# Patient Record
Sex: Male | Born: 1980 | Race: White | Hispanic: No | Marital: Married | Smoking: Never smoker
Health system: Southern US, Community
[De-identification: ages and names within clinical notes are randomized; demographics above are authoritative.]

## PROBLEM LIST (undated history)

## (undated) DIAGNOSIS — I10 Essential (primary) hypertension: Secondary | ICD-10-CM

## (undated) DIAGNOSIS — Z9884 Bariatric surgery status: Secondary | ICD-10-CM

## (undated) HISTORY — PX: LAPAROSCOPIC GASTRIC BANDING: SHX1100

## (undated) HISTORY — PX: LASIK: SHX215

## (undated) HISTORY — DX: Essential (primary) hypertension: I10

---

## 2012-09-27 ENCOUNTER — Emergency Department (HOSPITAL_COMMUNITY): Payer: PRIVATE HEALTH INSURANCE

## 2012-09-27 ENCOUNTER — Observation Stay (HOSPITAL_COMMUNITY)
Admission: EM | Admit: 2012-09-27 | Discharge: 2012-09-28 | Disposition: A | Discharge status: Home / Self Care | Payer: PRIVATE HEALTH INSURANCE | Attending: General Surgery | Admitting: General Surgery

## 2012-09-27 ENCOUNTER — Encounter (HOSPITAL_COMMUNITY): Payer: Self-pay | Admitting: *Deleted

## 2012-09-27 DIAGNOSIS — R109 Unspecified abdominal pain: Secondary | ICD-10-CM

## 2012-09-27 DIAGNOSIS — R1013 Epigastric pain: Secondary | ICD-10-CM | POA: Insufficient documentation

## 2012-09-27 DIAGNOSIS — K8020 Calculus of gallbladder without cholecystitis without obstruction: Secondary | ICD-10-CM | POA: Insufficient documentation

## 2012-09-27 DIAGNOSIS — K9509 Other complications of gastric band procedure: Principal | ICD-10-CM | POA: Insufficient documentation

## 2012-09-27 DIAGNOSIS — Y831 Surgical operation with implant of artificial internal device as the cause of abnormal reaction of the patient, or of later complication, without mention of misadventure at the time of the procedure: Secondary | ICD-10-CM | POA: Insufficient documentation

## 2012-09-27 DIAGNOSIS — Z9884 Bariatric surgery status: Secondary | ICD-10-CM

## 2012-09-27 HISTORY — DX: Bariatric surgery status: Z98.84

## 2012-09-27 LAB — COMPREHENSIVE METABOLIC PANEL
ALT: 21 U/L (ref 0–53)
Alkaline Phosphatase: 74 U/L (ref 39–117)
BUN: 9 mg/dL (ref 6–23)
CO2: 25 mEq/L (ref 19–32)
Chloride: 97 mEq/L (ref 96–112)
GFR calc Af Amer: >90 mL/min (ref >90)
Glucose, Bld: 123 mg/dL — ABNORMAL HIGH (ref 70–99)
Potassium: 4.2 mEq/L (ref 3.5–5.1)
Sodium: 133 mEq/L — ABNORMAL LOW (ref 135–145)
Total Bilirubin: 0.4 mg/dL (ref 0.3–1.2)
Total Protein: 7.3 g/dL (ref 6.0–8.3)

## 2012-09-27 LAB — CBC WITH DIFFERENTIAL/PLATELET
Eosinophils Absolute: 0.1 10*3/uL (ref 0.0–0.7)
Hemoglobin: 14.7 g/dL (ref 13.0–17.0)
Lymphocytes Relative: 17 % (ref 12–46)
Lymphs Abs: 2.2 10*3/uL (ref 0.7–4.0)
Monocytes Relative: 5 % (ref 3–12)
Neutro Abs: 10.2 10*3/uL — ABNORMAL HIGH (ref 1.7–7.7)
Neutrophils Relative %: 77 % (ref 43–77)
Platelets: 247 10*3/uL (ref 150–400)
RBC: 5.31 MIL/uL (ref 4.22–5.81)
WBC: 13.2 10*3/uL — ABNORMAL HIGH (ref 4.0–10.5)

## 2012-09-27 LAB — LIPASE, BLOOD: Lipase: 34 U/L (ref 11–59)

## 2012-09-27 MED ORDER — IOHEXOL 300 MG/ML  SOLN
100.0000 mL | Freq: Once | INTRAMUSCULAR | Status: AC | PRN
  Administered 2012-09-27: 100 mL via INTRAVENOUS

## 2012-09-27 MED ORDER — ACETAMINOPHEN 650 MG RE SUPP: 650.0000 mg | Freq: Four times a day (QID) | RECTAL | Status: DC | PRN

## 2012-09-27 MED ORDER — PANTOPRAZOLE SODIUM 40 MG IV SOLR
40.0000 mg | Freq: Two times a day (BID) | INTRAVENOUS | Status: DC
  Administered 2012-09-27 (×2): 40 mg via INTRAVENOUS
  Filled 2012-09-27 (×3): qty 40

## 2012-09-27 MED ORDER — KCL IN DEXTROSE-NACL 20-5-0.45 MEQ/L-%-% IV SOLN
INTRAVENOUS | Status: DC
  Administered 2012-09-27: 13:00:00 via INTRAVENOUS
  Filled 2012-09-27 (×4): qty 1000

## 2012-09-27 MED ORDER — MORPHINE SULFATE 4 MG/ML IJ SOLN
4.0000 mg | Freq: Once | INTRAMUSCULAR | Status: AC
  Administered 2012-09-27: 4 mg via INTRAVENOUS
  Filled 2012-09-27: qty 1

## 2012-09-27 MED ORDER — IOHEXOL 300 MG/ML  SOLN: 50.0000 mL | Freq: Once | INTRAMUSCULAR | Status: AC | PRN

## 2012-09-27 MED ORDER — ONDANSETRON HCL 4 MG/2ML IJ SOLN: 4.0000 mg | Freq: Four times a day (QID) | INTRAMUSCULAR | Status: DC | PRN

## 2012-09-27 MED ORDER — ACETAMINOPHEN 325 MG PO TABS
650.0000 mg | ORAL_TABLET | Freq: Four times a day (QID) | ORAL | Status: DC | PRN
  Administered 2012-09-27 – 2012-09-28 (×2): 650 mg via ORAL
  Filled 2012-09-27 (×2): qty 2

## 2012-09-27 MED ORDER — OXYCODONE HCL 5 MG PO TABS: 5.0000 mg | ORAL_TABLET | ORAL | Status: DC | PRN

## 2012-09-27 MED ORDER — HEPARIN SODIUM (PORCINE) 5000 UNIT/ML IJ SOLN
5000.0000 [IU] | Freq: Three times a day (TID) | INTRAMUSCULAR | Status: DC
  Administered 2012-09-27 – 2012-09-28 (×3): 5000 [IU] via SUBCUTANEOUS
  Filled 2012-09-27 (×6): qty 1

## 2012-09-27 MED ORDER — DIPHENHYDRAMINE HCL 12.5 MG/5ML PO ELIX: 12.5000 mg | ORAL_SOLUTION | Freq: Four times a day (QID) | ORAL | Status: DC | PRN

## 2012-09-27 MED ORDER — MORPHINE SULFATE 2 MG/ML IJ SOLN: 2.0000 mg | INTRAMUSCULAR | Status: DC | PRN

## 2012-09-27 MED ORDER — ONDANSETRON HCL 4 MG/2ML IJ SOLN
4.0000 mg | Freq: Once | INTRAMUSCULAR | Status: AC
  Administered 2012-09-27: 4 mg via INTRAVENOUS
  Filled 2012-09-27: qty 2

## 2012-09-27 MED ORDER — DIPHENHYDRAMINE HCL 50 MG/ML IJ SOLN: 12.5000 mg | Freq: Four times a day (QID) | INTRAMUSCULAR | Status: DC | PRN

## 2012-09-27 MED ORDER — IOHEXOL 300 MG/ML  SOLN: 50.0000 mL | Freq: Once | INTRAMUSCULAR | Status: DC | PRN

## 2012-09-27 NOTE — ED Notes (Signed)
Pt states 4-5 hours ago started having mid abdominal pain, states has had looser stools but denies n/v.

## 2012-09-27 NOTE — ED Notes (Signed)
Bed: WA25 Expected date:  Expected time:  Means of arrival:  Comments: Hall A 

## 2012-09-27 NOTE — ED Notes (Signed)
Lap-band access port needles at bedside

## 2012-09-27 NOTE — ED Provider Notes (Signed)
CSN: [id number]     Arrival date & time 09/27/12  0249 History   First MD Initiated Contact with Patient 09/27/12 [phone]     Chief Complaint  Patient presents with  . Abdominal Pain   (Consider location/radiation/quality/duration/timing/severity/associated sxs/prior Treatment) HPI Comments: Patient with history of lap band procedure performed about 3 years ago [place]. He presents today with complaints of epigastric pain that started approximately 4-5 hours prior to presentation. He states he was playing basketball at the time but denies injuring himself. He feels nauseated but cannot vomit. He denies any fevers or chills. He states his stool has been somewhat loose denies diarrhea. He has never had a problem with his lap band in the past  Patient is a 32 y.o. male presenting with abdominal pain. The history is provided by the patient.  Abdominal Pain Pain location:  Epigastric Pain quality: cramping   Pain radiates to:  Does not radiate Pain severity:  Severe Onset quality:  Sudden Duration:  5 hours Timing:  Constant Progression:  Worsening Chronicity:  New Context: previous surgery   Context: not alcohol use   Relieved by:  Nothing Worsened by:  Nothing tried   Past Medical History  Diagnosis Date  . LAP-BAND surgery status    Past Surgical History  Procedure Laterality Date  . Laparoscopic gastric banding     No family history on file. History  Substance Use Topics  . Smoking status: Never Smoker   . Smokeless tobacco: Never Used  . Alcohol Use: No    Review of Systems  Gastrointestinal: Positive for abdominal pain.  All other systems reviewed and are negative.    Allergies  Augmentin  Home Medications   Current Outpatient Rx  Name  Route  Sig  Dispense  Refill  . Omeprazole-Sodium Bicarbonate (ZEGERID) 20-1100 MG CAPS capsule   Oral   Take 1 capsule by mouth daily before breakfast.         . simethicone (MYLICON) 80 MG chewable tablet  Oral   Chew 80 mg by mouth every 6 (six) hours as needed for flatulence.          BP 122/85  Pulse 94  Temp(Src) 98.8 F (37.1 C) (Oral)  Resp 18  Ht 6' (1.829 m)  Wt 245 lb (111.131 kg)  BMI 33.22 kg/m2  SpO2 100% Physical Exam  Nursing note and vitals reviewed. Constitutional: He is oriented to person, place, and time. He appears well-developed and well-nourished. He appears distressed.  Patient appears uncomfortable.  HENT:  Head: Normocephalic.  Mouth/Throat: Oropharynx is clear and moist.  Neck: Normal range of motion. Neck supple.  Cardiovascular: Normal rate, regular rhythm and normal heart sounds.   No murmur heard. Pulmonary/Chest: Effort normal and breath sounds normal. No respiratory distress. He has no wheezes.  Abdominal: Soft. Bowel sounds are normal. He exhibits no distension. There is tenderness.  There is tenderness to palpation in the epigastrium that is mild. There is no rebound and no guarding.  Musculoskeletal: Normal range of motion. He exhibits no edema.  Neurological: He is alert and oriented to person, place, and time.  Skin: Skin is warm and dry. He is not diaphoretic.    ED Course  Procedures (including critical care time) Labs Review Labs Reviewed  CBC WITH DIFFERENTIAL  COMPREHENSIVE METABOLIC PANEL  LIPASE, BLOOD   Imaging Review No results found.  MDM  No diagnosis found. This patient presents with abdominal pain in the epigastric region. He is 3 years  status post lap band procedure in [place]. He has never had any complications or issues. Workup today reveals an elevated white count of 13,000 and acute abdominal series shows the gastric band with an increased phi angle, concerning for a slipped band positioning. I discussed the results of this with Dr. Purnell Shoemaker from general surgery who feels as though further workup with a CT scan is indicated. This was ordered and at the time of this dictation is in process and pending. Care  the patient will be signed out at shift change to the oncoming provider who will obtain the results of the CT scan and determine the final disposition.   Geoffery Lyons, MD 09/27/12 [phone]

## 2012-09-27 NOTE — H&P (Signed)
ATTENDING ADDENDUM:  I personally reviewed patient's record, examined the patient, and formulated the following assessment and plan: CT shows possible band slip.  Band fluid removed.  UGI neg for obstruction.  RUQ US shows mildly thickened GB wall with no signs of obstruction.  Case was reviewed with Dr Daphine Deutscher, one of our bariatric surgeons as well.  Pt still with crampy abd pain after swallow eval.  We discussed possible etiologies for his pain.  It does not sound like his pain is related to his gallbladder.  We will admit for overnight observation.  If he continues to feel better, we will discharge with close f/u with one of the bariatric surgeons.

## 2012-09-27 NOTE — H&P (Signed)
Sherrie George, PA-C Physician Assistant Incomplete Surgery Consult Note Service date: 09/27/2012 9:08 AM  Reason for Consult: Abdominal pain and history of Lap Band 3 years ago. Referring Physician: Dr. Juleen China (ER)   Dale Weber is an 32 y.o. male.   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  He says he thinks it's an Allergan brand.  He does not know how much fluid is in it.  He had gone from 300 pounds to 220, but has gained back up to about 245.  He was playing basketball last PM and started having pain.  He remained uncomfortable and couldn't get comfortable, pain got progressively worse.  He got to where he couldn't really stand the pain.  He says it's mid epigastric and hasn't really changed location.  He presented to the ER early this AM about 3:50 AM  Initial  film show the gastric band with an abnormal angle at 80 degree's  CT scan confirms the Phi angle is greater than 60 degrees.  Mild proximal slippage of the stomach was suspected.  No obstruction.  I got Dr. Daphine Deutscher to pull fluid, 3.8 cc removed.  We are getting a swallowing study to see how it looks now. He also has gallstones with some abdominal gallbladder wall thickeningon CT and Ultrasound. His symptoms are not related to food and this all started acutely last pm playing basketball.    He's never had this problem before, last PM. The Swallowing study with oral contrast shows an abnormal orientation of the LapBand compatible with slippage.  He apparently has a plicated lapband, from his recollection.  This may be the cause of it's abnormal appearance.  There was no obstruction. After completing these studies, and release of the fluid in his band; he still feels some discomfort.  We will admit him for observation and see how he does with full liquids. Recheck labs in AM.        Past Medical History   Diagnosis  Date   .  LAP-BAND surgery status  2011 in [place]. He thinks it's  an Allergan type band.         Past Surgical History   Procedure  Laterality  Date   .  Laparoscopic gastric banding          Family Hx:  Mother living and in good health Father:  Overweight, CAD, AODM and hypertension.               5 Brothers overweight.   Social History: reports that he has never smoked. He has never used smokeless tobacco. He reports that he does not drink alcohol or use illicit drugs. 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  Augmentin [Amoxicillin-Pot Clavulanate]  Nausea And Vomiting      Medications:   Prior to Admission medications    Medication  Sig  Start Date  End Date  Taking?  Authorizing Provider   Omeprazole-Sodium Bicarbonate (ZEGERID) 20-1100 MG CAPS capsule  Take 1 capsule by mouth daily before breakfast.      Yes  Historical Provider, MD   simethicone (MYLICON) 80 MG chewable tablet  Chew 80 mg by mouth every 6 (six) hours as needed for flatulence.      Yes  Historical Provider, MD  Anti-infectives     None          Results for orders placed during the hospital encounter of 09/27/12 (from the past 48 hour(s))   CBC WITH DIFFERENTIAL     Status: Abnormal     Collection Time      09/27/12  3:48 AM       Result  Value  Range     WBC  13.2 (*)  4.0 - 10.5 K/uL     RBC  5.31   4.22 - 5.81 MIL/uL     Hemoglobin  14.7   13.0 - 17.0 g/dL     HCT  16.1   09.6 - 52.0 %     MCV  78.2   78.0 - 100.0 fL     MCH  27.7   26.0 - 34.0 pg     MCHC  35.4   30.0 - 36.0 g/dL     RDW  04.5   40.9 - 15.5 %     Platelets  247   150 - 400 K/uL     Neutrophils Relative %  77   43 - 77 %     Neutro Abs  10.2 (*)  1.7 - 7.7 K/uL     Lymphocytes Relative  17   12 - 46 %     Lymphs Abs  2.2   0.7 - 4.0 K/uL     Monocytes Relative  5   3 - 12 %     Monocytes Absolute  0.6   0.1 - 1.0 K/uL     Eosinophils  Relative  1   0 - 5 %     Eosinophils Absolute  0.1   0.0 - 0.7 K/uL     Basophils Relative  0   0 - 1 %     Basophils Absolute  0.0   0.0 - 0.1 K/uL   COMPREHENSIVE METABOLIC PANEL     Status: Abnormal     Collection Time      09/27/12  3:48 AM       Result  Value  Range     Sodium  133 (*)  135 - 145 mEq/L     Potassium  4.2   3.5 - 5.1 mEq/L     Comment:  MODERATE HEMOLYSIS        HEMOLYSIS AT THIS LEVEL MAY AFFECT RESULT     Chloride  97   96 - 112 mEq/L     CO2  25   19 - 32 mEq/L     Glucose, Bld  123 (*)  70 - 99 mg/dL     BUN  9   6 - 23 mg/dL     Creatinine, Ser  8.11   0.50 - 1.35 mg/dL     Calcium  9.0   8.4 - 10.5 mg/dL     Total Protein  7.3   6.0 - 8.3 g/dL     Albumin  3.7   3.5 - 5.2 g/dL     AST  35   0 - 37 U/L     ALT  21   0 - 53 U/L     Comment:  MODERATE HEMOLYSIS        HEMOLYSIS AT THIS LEVEL MAY AFFECT RESULT     Alkaline Phosphatase  74   39 - 117 U/L     Comment:  MODERATE HEMOLYSIS        HEMOLYSIS AT THIS LEVEL MAY  AFFECT RESULT     Total Bilirubin  0.4   0.3 - 1.2 mg/dL     GFR calc non Af Amer  >90   >90 mL/min     GFR calc Af Amer  >90   >90 mL/min     Comment:  (NOTE)        The eGFR has been calculated using the CKD EPI equation.        This calculation has not been validated in all clinical situations.        eGFR's persistently <90 mL/min signify possible Chronic Kidney        Disease.   LIPASE, BLOOD     Status: None     Collection Time      09/27/12  3:48 AM       Result  Value  Range     Lipase  34   11 - 59 U/L      Ct Abdomen Pelvis W Contrast   09/27/2012   *RADIOLOGY REPORT*  Clinical Data: Epigastric pain  CT ABDOMEN AND PELVIS WITH CONTRAST  Technique:  Multidetector CT imaging of the abdomen and pelvis was performed following the standard protocol during bolus administration of intravenous contrast.  Contrast: OMNIPAQUE IOHEXOL 300 MG/ML  SOLN  Comparison: 09/27/2012  Findings: Mild dependent atelectasis.  Normal heart  size.  Mild hepatic steatosis.  Gallstones.  Mild gallbladder wall thickening.  No biliary ductal dilatation.  Unremarkable spleen, pancreas, adrenal glands.  Indeterminate hypodensity interpolar right kidney measuring 12 mm. No hydroureteronephrosis.  Again, the gastric phi angle is noted to be the greater than 60 degrees.  Mild proximal slippage of the stomach is suspected.  No obstruction. No intragastric erosion.  No CT evidence for colitis. Normal appendix.  Small bowel loops are normal course and caliber. No free intraperitoneal air or fluid.  No lymphadenopathy.  Normal caliber aorta and branch vessels.  Thin-walled bladder.  No acute osseous finding.  IMPRESSION: Gastric band slippage is again suggested.  No obstruction.  Distended and thick walled gallbladder, containing stones. Acute cholecystitis is not excluded.  Correlate clinically and with ultrasound if warranted.   Original Report Authenticated By: Jearld Lesch, M.D.    Dg Abd Acute W/chest   09/27/2012   *RADIOLOGY REPORT*  Clinical Data: Abdominal pain  ACUTE ABDOMEN SERIES (ABDOMEN 2 VIEW & CHEST 1 VIEW)  Comparison: None.  Findings: Lungs clear.  Cardiomediastinal contours within normal range.  No free intraperitoneal air.  Gastric band with phi angle approximately 80 degrees.  Reservoir projects over the left mid abdomen.  Air within small and large bowel.  Moderate proximal colonic stool burden.  Organ outlines normal where seen.  No acute osseous finding.  IMPRESSION: Gastric band with increased phi angle, can be seen slipped band positioning.  Consider an upper GI follow up if clinical symptoms warrant.  No obstruction, with air noted within normal caliber small and large bowel.   Original Report Authenticated By: Jearld Lesch, M.D.     Review of Systems  Constitutional: Negative.   HENT: Negative.   Eyes: Negative.   Respiratory: Negative.   Cardiovascular: Negative.   Gastrointestinal: Positive for heartburn (some with  really bad pain), nausea and abdominal pain (mid epigastric pain). Negative for vomiting.  Genitourinary: Negative.   Musculoskeletal: Negative.   Neurological: Negative.   Endo/Heme/Allergies: Negative.   Psychiatric/Behavioral: Negative.         Some anxiety, says he eats more with anxiety  Blood  pressure 144/94, pulse 66, temperature 98.8 F (37.1 C), temperature source Oral, resp. rate 16, height 6' (1.829 m), weight 111.131 kg (245 lb), SpO2 97.00%. Physical Exam  Constitutional: He is oriented to person, place, and time. He appears well-developed and well-nourished. He appears distressed (still having some pain, but much better.  ).  HENT:   Head: Normocephalic and atraumatic.   Nose: Nose normal.  Eyes: Conjunctivae and EOM are normal. Pupils are equal, round, and reactive to light. Right eye exhibits no discharge. Left eye exhibits no discharge. No scleral icterus.  Neck: Normal range of motion. Neck supple. No JVD present. No tracheal deviation present. No thyromegaly present.  Cardiovascular: Normal rate, regular rhythm, normal heart sounds and intact distal pulses.  Exam reveals no gallop.    No murmur heard. Respiratory: Effort normal and breath sounds normal. No respiratory distress. He has no wheezes. He has no rales. He exhibits no tenderness.  GI: Soft. Bowel sounds are normal. He exhibits no distension and no mass. There is no tenderness. There is no rebound and no guarding.  Port mid to [employer] [facility] [employer]  Musculoskeletal: He exhibits no edema and no tenderness.  Lymphadenopathy:    He has no cervical adenopathy.  Neurological: He is alert and oriented to person, place, and time. No cranial nerve deficit.  Skin: Skin is warm and dry. No rash noted. He is not diaphoretic. No erythema. No pallor.  Psychiatric: He has a normal mood and affect. His behavior is normal. Judgment and thought content normal.    Assessment/Plan:   1.  Abdominal pain with lap band slippage 2.   Cholelithiasis    Plan:  Admit for observation and see how he does.  Recheck labs in AM.      Cole Klugh 09/27/2012, 9:08 AM

## 2012-09-27 NOTE — ED Notes (Signed)
Surgeon at bedside.  

## 2012-09-28 LAB — CBC
HCT: 41.1 % (ref 39.0–52.0)
Hemoglobin: 14.9 g/dL (ref 13.0–17.0)
MCV: 78.1 fL (ref 78.0–100.0)
WBC: 10.9 10*3/uL — ABNORMAL HIGH (ref 4.0–10.5)

## 2012-09-28 LAB — BASIC METABOLIC PANEL
CO2: 28 mEq/L (ref 19–32)
Chloride: 101 mEq/L (ref 96–112)
GFR calc Af Amer: >90 mL/min (ref >90)
Potassium: 3.9 mEq/L (ref 3.5–5.1)

## 2012-09-28 NOTE — Care Management Note (Signed)
    Page 1 of 1   09/28/2012     11:07:11 AM   CARE MANAGEMENT NOTE 09/28/2012  Patient:  Dale Weber, Dale Weber   Account Number:  [id number]  Date Initiated:  09/28/2012  Documentation initiated by:  Lorenda Ishihara  Subjective/Objective Assessment:   32 yo admitted with abd pain, suspect lap band slipped. PTA lived at home with spouse.     Action/Plan:   Home when stable   Anticipated DC Date:  09/28/2012   Anticipated DC Plan:  HOME/SELF CARE      DC Planning Services  CM consult      Choice offered to / List presented to:             Status of service:  Completed, signed off Medicare Important Message given?   (If response is "NO", the following Medicare IM given date fields will be blank) Date Medicare IM given:   Date Additional Medicare IM given:    Discharge Disposition:  HOME/SELF CARE  Per UR Regulation:  Reviewed for med. necessity/level of care/duration of stay  If discussed at Long Length of Stay Meetings, dates discussed:    Comments:

## 2012-09-28 NOTE — Discharge Summary (Signed)
  Physician Discharge Summary  Patient ID: Dale Weber MRN: [id number] DOB/AGE: November 30, 1980 32 y.o.  Admit date: 09/27/2012 Discharge date: 09/28/2012  Admitting Diagnosis: Abdominal Pain  Discharge Diagnosis Slipped Lap Band  Consultants None  Procedures Removal of fluid from lap band  Hospital Course:  32 yr old male with h/o gastric lap band in [place] who presented to [facility] with abdominal pain.  Evaluation suggested a slipped lap band.  A swallow study was done which showed no obstructions.  Dr. Daphine Deutscher removed the fluid from his lap band and he was admitted for observation.  Overnight he had no problems and was able to tolerate a bariatric diet.  His pain was mostly resolved and it was felt he could be discharged home.  He will follow up with Dr. Biagio Quint as an outpatient.  Physical Exam:  VSS afebrile General: NAD Abd: Soft, nontender, +BS Ext: warm, no edema     Medication List         Omeprazole-Sodium Bicarbonate 20-1100 MG Caps capsule  Commonly known as:  ZEGERID  Take 1 capsule by mouth daily before breakfast.     simethicone 80 MG chewable tablet  Commonly known as:  MYLICON  Chew 80 mg by mouth every 6 (six) hours as needed for flatulence.             Follow-up Information   Follow up with LAYTON, BRIAN DAVID, DO. (Our office will contact you with your follow up appt and time)    Specialty:  General Surgery   Contact information:   [address] [place] [address] [phone]       Signed: Denny Levy [facility] [phone]  09/28/2012, 7:46 AM

## 2012-09-28 NOTE — Discharge Summary (Signed)
ATTENDING ADDENDUM:  I personally reviewed patient's record, examined the patient, and formulated the following assessment and plan:  Feels much better.  Tolerating a diet without nausea or pain

## 2012-10-05 ENCOUNTER — Encounter (INDEPENDENT_AMBULATORY_CARE_PROVIDER_SITE_OTHER): Payer: PRIVATE HEALTH INSURANCE | Admitting: General Surgery

## 2012-10-05 ENCOUNTER — Other Ambulatory Visit (INDEPENDENT_AMBULATORY_CARE_PROVIDER_SITE_OTHER): Payer: Self-pay

## 2012-10-05 DIAGNOSIS — Z9884 Bariatric surgery status: Secondary | ICD-10-CM

## 2012-10-06 ENCOUNTER — Telehealth (INDEPENDENT_AMBULATORY_CARE_PROVIDER_SITE_OTHER): Payer: Self-pay

## 2012-10-06 NOTE — Telephone Encounter (Signed)
Called and left message for patient re: UGI scheduled for 10/20/12 arrive 7:45am for 8:00am appt.  Patient need's to be NPO after midnight.  [facility] [address]

## 2012-10-20 ENCOUNTER — Ambulatory Visit
Admission: RE | Admit: 2012-10-20 | Discharge: 2012-10-20 | Disposition: A | Discharge status: Home / Self Care | Payer: PRIVATE HEALTH INSURANCE | Source: Ambulatory Visit | Attending: General Surgery | Admitting: General Surgery

## 2012-10-20 ENCOUNTER — Other Ambulatory Visit (INDEPENDENT_AMBULATORY_CARE_PROVIDER_SITE_OTHER): Payer: Self-pay | Admitting: General Surgery

## 2012-10-20 DIAGNOSIS — Z9884 Bariatric surgery status: Secondary | ICD-10-CM

## 2012-12-07 ENCOUNTER — Other Ambulatory Visit: Payer: Self-pay

## 2012-12-10 ENCOUNTER — Telehealth: Payer: Self-pay | Admitting: "Endocrinology

## 2012-12-27 NOTE — Telephone Encounter (Signed)
  During this call the computer suddenly lost contact with the internet. I had to re-boot the computer and start a new telephone entry note.

## 2013-01-14 ENCOUNTER — Telehealth: Payer: Self-pay | Admitting: "Endocrinology

## 2013-01-14 NOTE — Telephone Encounter (Signed)
This chart entered in error. I was trying to find the chart of a child with the same name.

## 2015-04-10 IMAGING — RF DG UGI W/ HIGH DENSITY W/KUB
14 of 24 series · 14 of 24 positions shown · non-contrast
Comparison: CT of the abdomen and pelvis 09/27/2012.

CLINICAL DATA: LapBand slippage. Evaluation to exclude potential
obstruction at the level of the LapBand.

UPPER GI SERIES W/HIGH DENSITY W/KUB
TECHNIQUE: After obtaining a scout radiograph, upper GI series
performed with high density barium and effervescent agent. Thin
barium also used.
Fluoroscopy Time: 37 seconds

[Series 1: run · 1 of 1 slices shown (1 of 14)]
[im 1/1]
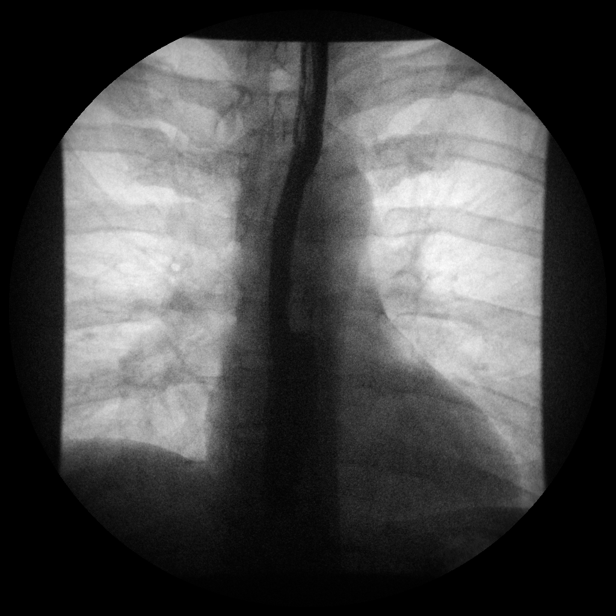

[Series 2: run · 1 of 1 slices shown (2 of 14)]
[im 1/1]
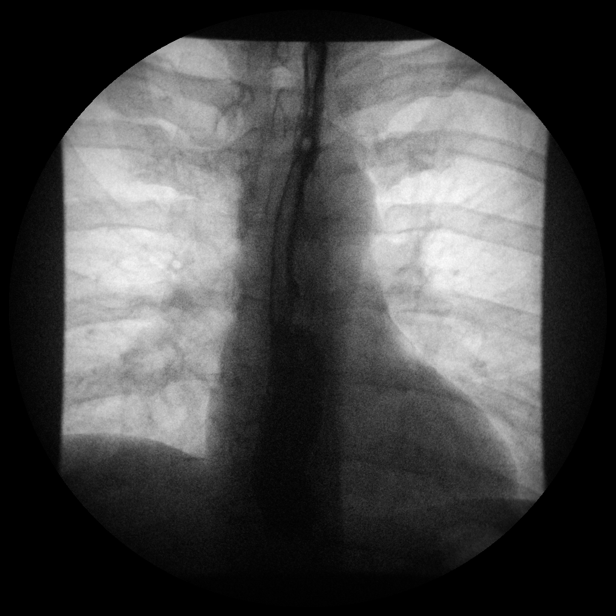

[Series 3: run · 1 of 1 slices shown (3 of 14)]
[im 1/1]
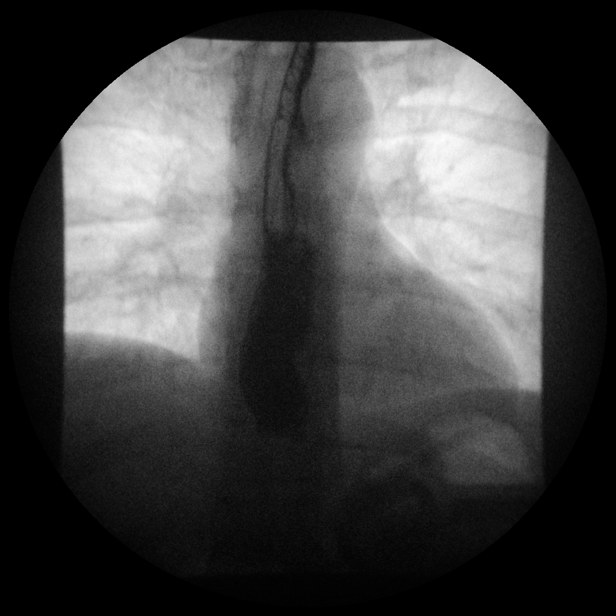

[Series 4: run · 1 of 1 slices shown (4 of 14)]
[im 1/1]
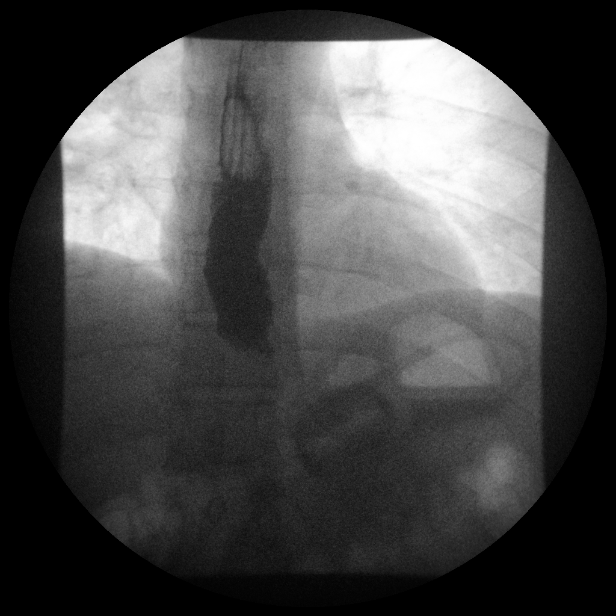

[Series 4: run · 1 of 1 slices shown (5 of 14)]
[im 1/1]
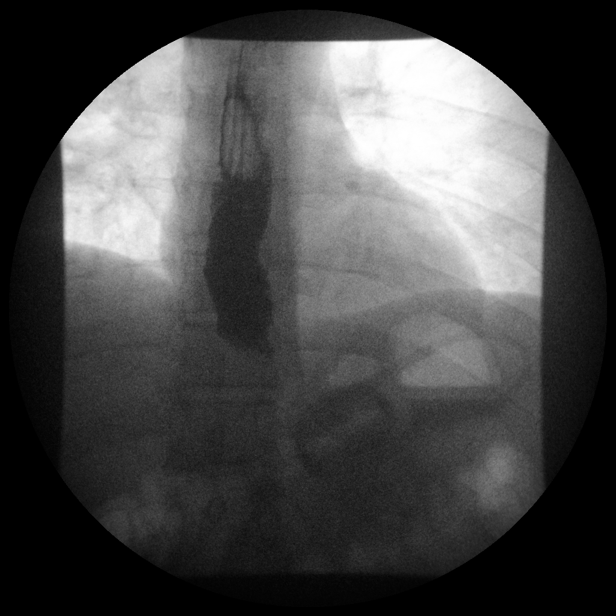

[Series 5: run · 1 of 1 slices shown (6 of 14)]
[im 1/1]
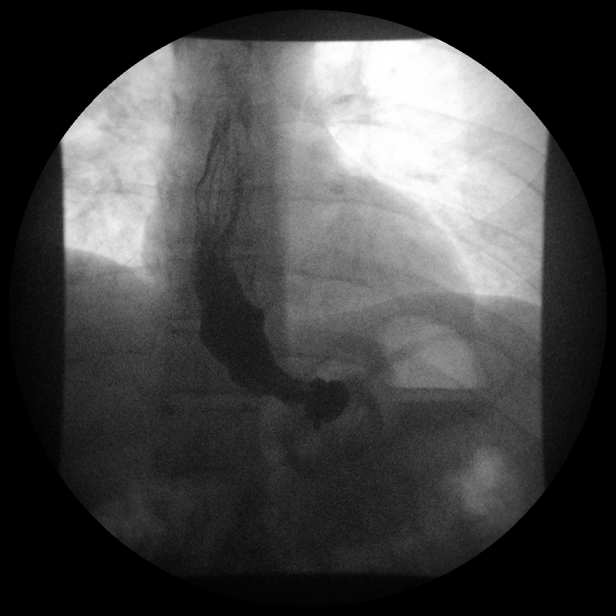

[Series 6: run · 1 of 1 slices shown (7 of 14)]
[im 1/1]
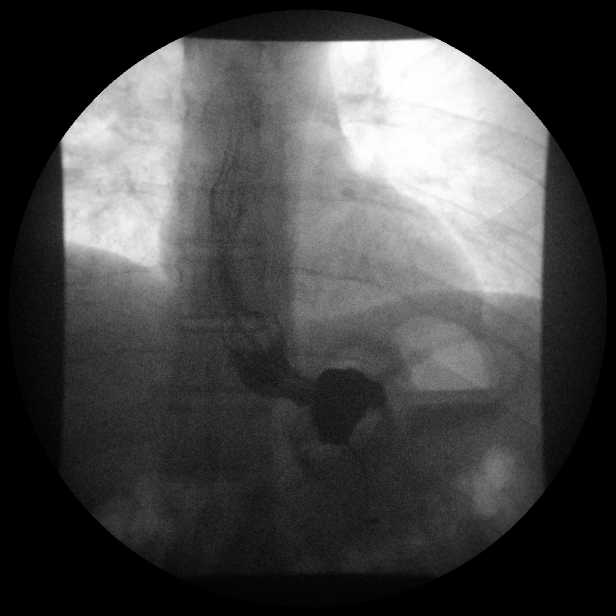

[Series 7: run · 1 of 1 slices shown (8 of 14)]
[im 1/1]
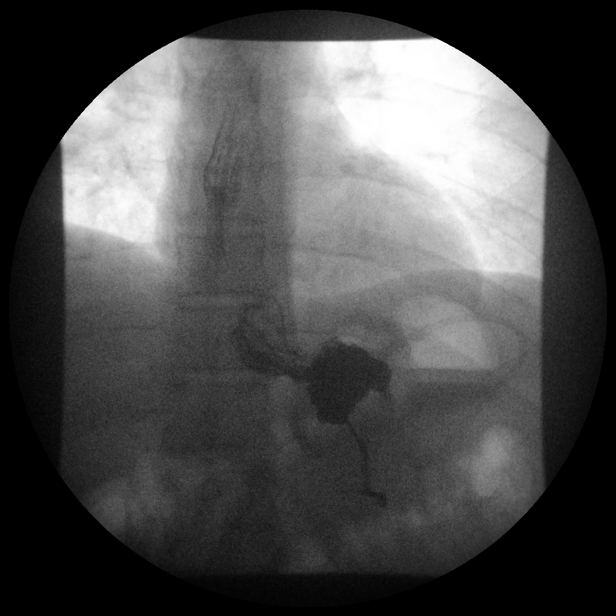

[Series 8: run · 1 of 1 slices shown (9 of 14)]
[im 1/1]
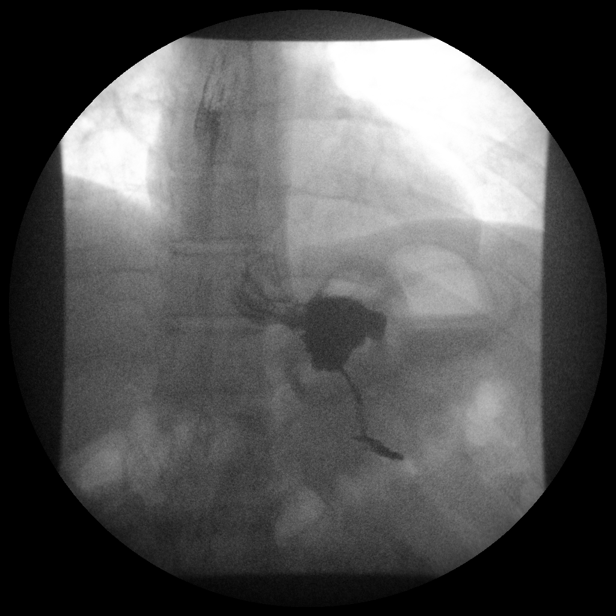

[Series 9: run · 1 of 1 slices shown (10 of 14)]
[im 1/1]
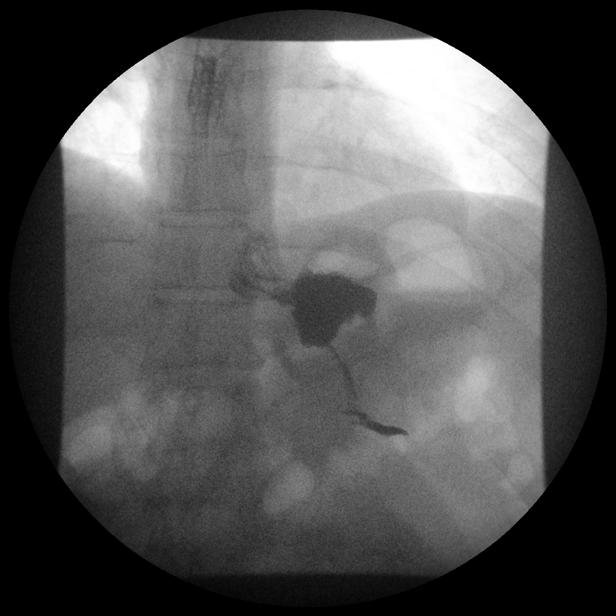

[Series 10: run · 1 of 1 slices shown (11 of 14)]
[im 1/1]
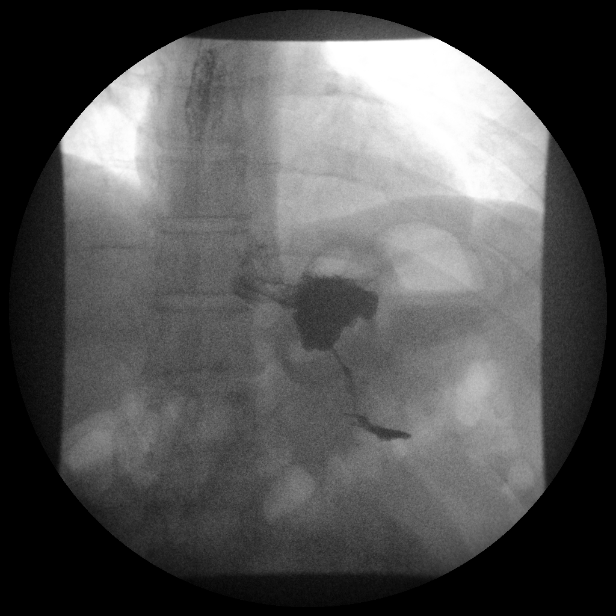

[Series 10: run · 1 of 1 slices shown (12 of 14)]
[im 1/1]
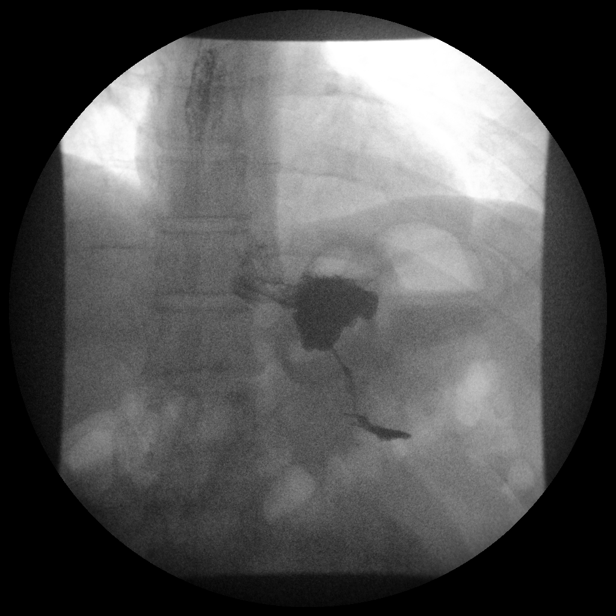

[Series 11: run · 1 of 1 slices shown (13 of 14)]
[im 1/1]
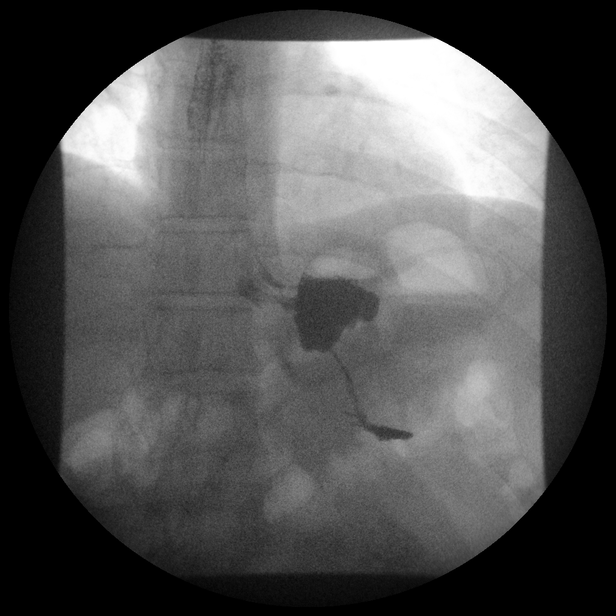

[Series 12: run · 1 of 1 slices shown (14 of 14)]
[im 1/1]
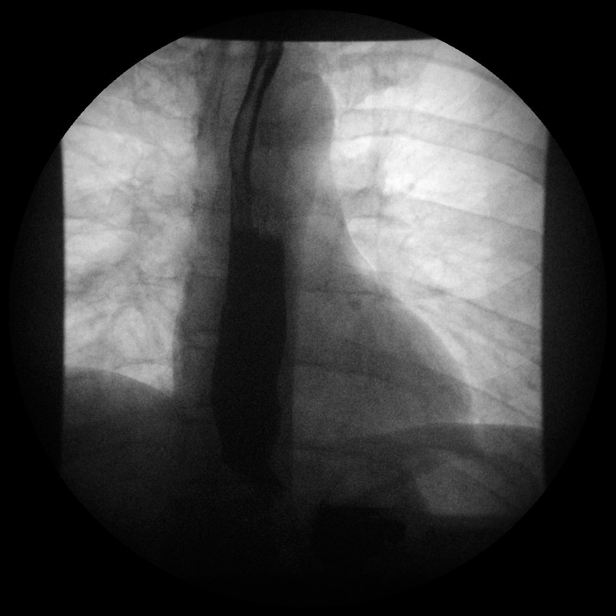

[14 of 24 positions shown; findings below may reference images not displayed]

FINDINGS: Initial KUB demonstrated an abnormal circular appearance
of the LapBand related to an abnormal anterior to posterior
inclination.  The band is oriented from the 2 o'clock to 8 o'clock
position.  Oral contrast material is noted within the stomach and
small bowel.

Multiple sequential fluoroscopic spot films were obtained with the
patient swallowing thin barium preparation.  This demonstrated
rapid transit of the barium through the esophagus into the gastric
pouch, which appeared significantly enlarged.  The barium readily
traverse the LapBand  into the more distal aspect of the stomach
without any significant delay.
IMPRESSION: 1.  Abnormal orientation of the LapBand compatible with LapBand
slippage, as above. The proximal gastric pouch is clearly enlarged.
2.  No evidence of obstruction at the level of the LapBand.

## 2017-04-01 ENCOUNTER — Ambulatory Visit (INDEPENDENT_AMBULATORY_CARE_PROVIDER_SITE_OTHER): Payer: PRIVATE HEALTH INSURANCE | Admitting: Family Medicine

## 2017-04-01 ENCOUNTER — Encounter: Payer: Self-pay | Admitting: Family Medicine

## 2017-04-01 VITALS — BP 142/90 | HR 75 | Temp 97.7°F | Ht 72.0 in | Wt 252.6 lb

## 2017-04-01 DIAGNOSIS — B354 Tinea corporis: Secondary | ICD-10-CM

## 2017-04-01 DIAGNOSIS — I10 Essential (primary) hypertension: Secondary | ICD-10-CM | POA: Insufficient documentation

## 2017-04-01 DIAGNOSIS — R030 Elevated blood-pressure reading, without diagnosis of hypertension: Secondary | ICD-10-CM | POA: Diagnosis not present

## 2017-04-01 MED ORDER — KETOCONAZOLE 2 % EX CREA: 1.0000 "application " | TOPICAL_CREAM | Freq: Every day | CUTANEOUS | 0 refills | Status: DC

## 2017-04-01 NOTE — Progress Notes (Signed)
Subjective:  Dale Weber is a 37 y.o. male who presents today with a chief complaint of elevated blood pressure reading.   HPI:  Elevated blood pressure reading, new problem Patient has noticed elevated blood pressure readings over last month.  Usually in the 140s-90s.  No chest pain or shortness of breath.  Has never been on treatment for this.  Of note, patient recently started phentermine to help him with weight loss.  Does not think that this has increase his blood pressure.  Rash, new problem Located on bilateral lower extremities.  Initially was only on right lower extremity.  Initially thought that it was consistent with ringworm and tried over-the-counter antifungals including Lotrimin and miconazole which have not significantly seem to help.  Rash is since spread to his left lower extremity.  Rash is very pruritic.  No bleeding.  No drainage.  No scaling.  No other treatments tried.  No obvious precipitating events.  No other obvious alleviating or aggravating factors.  ROS: Per HPI, otherwise a complete review of systems was negative.   PMH:  The following were reviewed and entered/updated in epic: Past Medical History:  Diagnosis Date  . LAP-BAND surgery status    Patient Active Problem List   Diagnosis Date Noted  . Elevated blood pressure reading 04/01/2017   Past Surgical History:  Procedure Laterality Date  . LAPAROSCOPIC GASTRIC BANDING    . LASIK     Father with HTN and DM.  Medications- reviewed and updated Current Outpatient Medications  Medication Sig Dispense Refill  . phentermine (ADIPEX-P) 37.5 MG tablet Take 37.5 mg by mouth daily.  0  . ketoconazole (NIZORAL) 2 % cream Apply 1 application topically daily. 15 g 0   No current facility-administered medications for this visit.    Allergies-reviewed and updated Allergies  Allergen Reactions  . Augmentin [Amoxicillin-Pot Clavulanate] Nausea And Vomiting   Social History   Socioeconomic History   . Marital status: Married    Spouse name: None  . Number of children: 5  . Years of education: None  . Highest education level: None  Social Needs  . Financial resource strain: None  . Food insecurity - worry: None  . Food insecurity - inability: None  . Transportation needs - medical: None  . Transportation needs - non-medical: None  Occupational History  . None  Tobacco Use  . Smoking status: Never Smoker  . Smokeless tobacco: Never Used  Substance and Sexual Activity  . Alcohol use: No  . Drug use: No  . Sexual activity: No  Other Topics Concern  . None  Social History Narrative  . None   Objective:  Physical Exam: BP (!) 142/90 (BP Location: Left Arm, Patient Position: Sitting, Cuff Size: Normal)   Pulse 75   Temp 97.7 F (36.5 C) (Oral)   Ht 6' (1.829 m)   Wt 252 lb 9.6 oz (114.6 kg)   SpO2 97%   BMI 34.26 kg/m   Gen: NAD, resting comfortably CV: RRR with no murmurs appreciated Pulm: NWOB, CTAB with no crackles, wheezes, or rhonchi GI: Normal bowel sounds present. Soft, Nontender, Nondistended. MSK: No edema, cyanosis, or clubbing noted Skin: Well demarcated approximately 8-9 cm erythematous lesion on right calf.  Several smaller, erythematous, well-demarcated lesions approximately 2-3 cm in diameter on left calf. Neuro: Grossly normal, moves all extremities Psych: Normal affect and thought content  Assessment/Plan:  Elevated blood pressure reading Slightly above goal today.  We will not start medication today.  Advised continued home blood pressure monitoring with goal 140/90 or lower.  He will follow-up with me soon for his CPE.  If persistently elevated above 140/90, would consider starting antihypertensive therapy.  Discussed role of phentermine may be playing.  We will continue to monitor closely.  Rash Consistent with tinea corporis.  Start topical ketoconazole.  If no improvement, would consider oral antifungals.  Preventive healthcare Patient will  return soon for CPE.  Algis Greenhouse. [name], MD 04/01/2017 4:45 PM

## 2017-04-01 NOTE — Assessment & Plan Note (Signed)
Slightly above goal today.  We will not start medication today.  Advised continued home blood pressure monitoring with goal 140/90 or lower.  He will follow-up with me soon for his CPE.  If persistently elevated above 140/90, would consider starting antihypertensive therapy.  Discussed role of phentermine may be playing.  We will continue to monitor closely.

## 2017-04-01 NOTE — Patient Instructions (Addendum)
Start the ketoconazole.  Come back to see me in a few weeks for your physical and recheck your blood work.  Take care, Dr [name]   [facility] Eating Plan DASH stands for "Dietary Approaches to Stop Hypertension." The DASH eating plan is a healthy eating plan that has been shown to reduce high blood pressure (hypertension). It may also reduce your risk for type 2 diabetes, heart disease, and stroke. The DASH eating plan may also help with weight loss. What are tips for following this plan? General guidelines  Avoid eating more than 2,300 mg (milligrams) of salt (sodium) a day. If you have hypertension, you may need to reduce your sodium intake to 1,500 mg a day.  Limit alcohol intake to no more than 1 drink a day for nonpregnant women and 2 drinks a day for men. One drink equals 12 oz of beer, 5 oz of wine, or 1 oz of hard liquor.  Work with your health care provider to maintain a healthy body weight or to lose weight. Ask what an ideal weight is for you.  Get at least 30 minutes of exercise that causes your heart to beat faster (aerobic exercise) most days of the week. Activities may include walking, swimming, or biking.  Work with your health care provider or diet and nutrition specialist (dietitian) to adjust your eating plan to your individual calorie needs. Reading food labels  Check food labels for the amount of sodium per serving. Choose foods with less than 5 percent of the Daily Value of sodium. Generally, foods with less than 300 mg of sodium per serving fit into this eating plan.  To find whole grains, look for the word "whole" as the first word in the ingredient list. Shopping  Buy products labeled as "low-sodium" or "no salt added."  Buy fresh foods. Avoid canned foods and premade or frozen meals. Cooking  Avoid adding salt when cooking. Use salt-free seasonings or herbs instead of table salt or sea salt. Check with your health care provider or pharmacist before using salt  substitutes.  Do not fry foods. Cook foods using healthy methods such as baking, boiling, grilling, and broiling instead.  Cook with heart-healthy oils, such as olive, canola, soybean, or sunflower oil. Meal planning   Eat a balanced diet that includes: ? 5 or more servings of fruits and vegetables each day. At each meal, try to fill half of your plate with fruits and vegetables. ? Up to 6-8 servings of whole grains each day. ? Less than 6 oz of lean meat, poultry, or fish each day. A 3-oz serving of meat is about the same size as a deck of cards. One egg equals 1 oz. ? 2 servings of low-fat dairy each day. ? A serving of nuts, seeds, or beans 5 times each week. ? Heart-healthy fats. Healthy fats called Omega-3 fatty acids are found in foods such as flaxseeds and coldwater fish, like sardines, salmon, and mackerel.  Limit how much you eat of the following: ? Canned or prepackaged foods. ? Food that is high in trans fat, such as fried foods. ? Food that is high in saturated fat, such as fatty meat. ? Sweets, desserts, sugary drinks, and other foods with added sugar. ? Full-fat dairy products.  Do not salt foods before eating.  Try to eat at least 2 vegetarian meals each week.  Eat more home-cooked food and less restaurant, buffet, and fast food.  When eating at a restaurant, ask that your food be  prepared with less salt or no salt, if possible. What foods are recommended? The items listed may not be a complete list. Talk with your dietitian about what dietary choices are best for you. Grains Whole-grain or whole-wheat bread. Whole-grain or whole-wheat pasta. Bitton rice. [place]. Bulgur. Whole-grain and low-sodium cereals. Pita bread. Low-fat, low-sodium crackers. Whole-wheat flour tortillas. Vegetables Fresh or frozen vegetables (raw, steamed, roasted, or grilled). Low-sodium or reduced-sodium tomato and vegetable juice. Low-sodium or reduced-sodium tomato sauce and tomato  paste. Low-sodium or reduced-sodium canned vegetables. Fruits All fresh, dried, or frozen fruit. Canned fruit in natural juice (without added sugar). Meat and other protein foods Skinless chicken or [place]. Ground chicken or [place]. Pork with fat trimmed off. Fish and seafood. Egg whites. Dried beans, peas, or lentils. Unsalted nuts, nut butters, and seeds. Unsalted canned beans. Lean cuts of beef with fat trimmed off. Low-sodium, lean deli meat. Dairy Low-fat (1%) or fat-free (skim) milk. Fat-free, low-fat, or reduced-fat cheeses. Nonfat, low-sodium ricotta or cottage cheese. Low-fat or nonfat yogurt. Low-fat, low-sodium cheese. Fats and oils Soft margarine without trans fats. Vegetable oil. Low-fat, reduced-fat, or light mayonnaise and salad dressings (reduced-sodium). Canola, safflower, olive, soybean, and sunflower oils. Avocado. Seasoning and other foods Herbs. Spices. Seasoning mixes without salt. Unsalted popcorn and pretzels. Fat-free sweets. What foods are not recommended? The items listed may not be a complete list. Talk with your dietitian about what dietary choices are best for you. Grains Baked goods made with fat, such as croissants, muffins, or some breads. Dry pasta or rice meal packs. Vegetables Creamed or fried vegetables. Vegetables in a cheese sauce. Regular canned vegetables (not low-sodium or reduced-sodium). Regular canned tomato sauce and paste (not low-sodium or reduced-sodium). Regular tomato and vegetable juice (not low-sodium or reduced-sodium). Angie Fava. Olives. Fruits Canned fruit in a light or heavy syrup. Fried fruit. Fruit in cream or butter sauce. Meat and other protein foods Fatty cuts of meat. Ribs. Fried meat. Berniece Salines. Sausage. Bologna and other processed lunch meats. Salami. Fatback. Hotdogs. Bratwurst. Salted nuts and seeds. Canned beans with added salt. Canned or smoked fish. Whole eggs or egg yolks. Chicken or [place] with skin. Dairy Whole or 2% milk,  cream, and half-and-half. Whole or full-fat cream cheese. Whole-fat or sweetened yogurt. Full-fat cheese. Nondairy creamers. Whipped toppings. Processed cheese and cheese spreads. Fats and oils Butter. Stick margarine. Lard. Shortening. Ghee. Bacon fat. Tropical oils, such as coconut, palm kernel, or palm oil. Seasoning and other foods Salted popcorn and pretzels. Onion salt, garlic salt, seasoned salt, table salt, and sea salt. Worcestershire sauce. Tartar sauce. Barbecue sauce. Teriyaki sauce. Soy sauce, including reduced-sodium. Steak sauce. Canned and packaged gravies. Fish sauce. Oyster sauce. Cocktail sauce. Horseradish that you find on the shelf. Ketchup. Mustard. Meat flavorings and tenderizers. Bouillon cubes. Hot sauce and Tabasco sauce. Premade or packaged marinades. Premade or packaged taco seasonings. Relishes. Regular salad dressings. Where to find more information:  National Heart, Lung, and [place]: https://wilson-eaton.com/  American Heart Association: www.heart.org Summary  The DASH eating plan is a healthy eating plan that has been shown to reduce high blood pressure (hypertension). It may also reduce your risk for type 2 diabetes, heart disease, and stroke.  With the DASH eating plan, you should limit salt (sodium) intake to 2,300 mg a day. If you have hypertension, you may need to reduce your sodium intake to 1,500 mg a day.  When on the DASH eating plan, aim to eat more fresh fruits and vegetables, whole grains,  lean proteins, low-fat dairy, and heart-healthy fats.  Work with your health care provider or diet and nutrition specialist (dietitian) to adjust your eating plan to your individual calorie needs. This information is not intended to replace advice given to you by your health care provider. Make sure you discuss any questions you have with your health care provider. Document Released: 01/07/2011 Document Revised: 01/12/2016 Document Reviewed: 01/12/2016 Elsevier  Interactive Patient Education  [employer].

## 2018-05-17 ENCOUNTER — Telehealth: Payer: Self-pay | Admitting: Family Medicine

## 2018-05-17 NOTE — Telephone Encounter (Signed)
Pt not seen in over a year. Called pt and left vm to schedule visit

## 2019-03-21 ENCOUNTER — Telehealth: Payer: Self-pay | Admitting: Family Medicine

## 2019-03-21 NOTE — Telephone Encounter (Signed)
A user error has taken place.

## 2019-04-13 ENCOUNTER — Other Ambulatory Visit: Payer: PRIVATE HEALTH INSURANCE

## 2019-04-27 ENCOUNTER — Encounter: Payer: Self-pay | Admitting: Family Medicine

## 2019-04-27 ENCOUNTER — Other Ambulatory Visit: Payer: Self-pay

## 2019-04-27 ENCOUNTER — Ambulatory Visit (INDEPENDENT_AMBULATORY_CARE_PROVIDER_SITE_OTHER): Payer: PRIVATE HEALTH INSURANCE | Admitting: Family Medicine

## 2019-04-27 VITALS — BP 140/98 | HR 88 | Temp 97.7°F | Ht 72.0 in | Wt 255.2 lb

## 2019-04-27 DIAGNOSIS — Z6834 Body mass index (BMI) 34.0-34.9, adult: Secondary | ICD-10-CM

## 2019-04-27 DIAGNOSIS — E785 Hyperlipidemia, unspecified: Secondary | ICD-10-CM | POA: Diagnosis not present

## 2019-04-27 DIAGNOSIS — Z0001 Encounter for general adult medical examination with abnormal findings: Secondary | ICD-10-CM | POA: Diagnosis not present

## 2019-04-27 DIAGNOSIS — I10 Essential (primary) hypertension: Secondary | ICD-10-CM

## 2019-04-27 DIAGNOSIS — D225 Melanocytic nevi of trunk: Secondary | ICD-10-CM | POA: Diagnosis not present

## 2019-04-27 DIAGNOSIS — L989 Disorder of the skin and subcutaneous tissue, unspecified: Secondary | ICD-10-CM

## 2019-04-27 MED ORDER — LOSARTAN POTASSIUM-HCTZ 100-25 MG PO TABS: 1.0000 | ORAL_TABLET | Freq: Every day | ORAL | 3 refills | Status: DC

## 2019-04-27 NOTE — Assessment & Plan Note (Signed)
Above goal.  Will start losartan-HCTZ 100-25 tablet once daily.  He will continue monitoring at home with goal 140/90 or lower.  He will follow-up in a few weeks if still not at goal.  Recent labs including C met within acceptable limits.  He will send [place] a copy of his recent labs.

## 2019-04-27 NOTE — Patient Instructions (Signed)
It was very nice to see you today!  We took a biopsy of the spot on your back.  You should come back by next week.  Please start losartan-HCTZ.  Please let me know if your blood pressure continues to be elevated above 140/90.  Come back in 1 year for your next physical with  Take care, Dr [name]  Please try these tips to maintain a healthy lifestyle:   Eat at least 3 REAL meals and 1-2 snacks per day.  Aim for no more than 5 hours between eating.  If you eat breakfast, please do so within one hour of getting up.    Each meal should contain half fruits/vegetables, one quarter protein, and one quarter carbs (no bigger than a computer mouse)   Cut down on sweet beverages. This includes juice, soda, and sweet tea.     Drink at least 1 glass of water with each meal and aim for at least 8 glasses per day   Exercise at least 150 minutes every week.    Preventive Care 41-35 Years Old, Male Preventive care refers to lifestyle choices and visits with your health care provider that can promote health and wellness. This includes:  A yearly physical exam. This is also called an annual well check.  Regular dental and eye exams.  Immunizations.  Screening for certain conditions.  Healthy lifestyle choices, such as eating a healthy diet, getting regular exercise, not using drugs or products that contain nicotine and tobacco, and limiting alcohol use. What can I expect for my preventive care visit? Physical exam Your health care provider will check:  Height and weight. These may be used to calculate body mass index (BMI), which is a measurement that tells if you are at a healthy weight.  Heart rate and blood pressure.  Your skin for abnormal spots. Counseling Your health care provider may ask you questions about:  Alcohol, tobacco, and drug use.  Emotional well-being.  Home and relationship well-being.  Sexual activity.  Eating habits.  Work and work Statistician. What  immunizations do I need?  Influenza (flu) vaccine  This is recommended every year. Tetanus, diphtheria, and pertussis (Tdap) vaccine  You may need a Td booster every 10 years. Varicella (chickenpox) vaccine  You may need this vaccine if you have not already been vaccinated. Zoster (shingles) vaccine  You may need this after age 32. Measles, mumps, and rubella (MMR) vaccine  You may need at least one dose of MMR if you were born in 1957 or later. You may also need a second dose. Pneumococcal conjugate (PCV13) vaccine  You may need this if you have certain conditions and were not previously vaccinated. Pneumococcal polysaccharide (PPSV23) vaccine  You may need one or two doses if you smoke cigarettes or if you have certain conditions. Meningococcal conjugate (MenACWY) vaccine  You may need this if you have certain conditions. Hepatitis A vaccine  You may need this if you have certain conditions or if you travel or work in places where you may be exposed to hepatitis A. Hepatitis B vaccine  You may need this if you have certain conditions or if you travel or work in places where you may be exposed to hepatitis B. Haemophilus influenzae type b (Hib) vaccine  You may need this if you have certain risk factors. Human papillomavirus (HPV) vaccine  If recommended by your health care provider, you may need three doses over 6 months. You may receive vaccines as individual doses or as  more than one vaccine together in one shot (combination vaccines). Talk with your health care provider about the risks and benefits of combination vaccines. What tests do I need? Blood tests  Lipid and cholesterol levels. These may be checked every 5 years, or more frequently if you are over 31 years old.  Hepatitis C test.  Hepatitis B test. Screening  Lung cancer screening. You may have this screening every year starting at age 23 if you have a 30-pack-year history of smoking and currently smoke  or have quit within the past 15 years.  Prostate cancer screening. Recommendations will vary depending on your family history and other risks.  Colorectal cancer screening. All adults should have this screening starting at age 35 and continuing until age 6. Your health care provider may recommend screening at age 24 if you are at increased risk. You will have tests every 1-10 years, depending on your results and the type of screening test.  Diabetes screening. This is done by checking your blood sugar (glucose) after you have not eaten for a while (fasting). You may have this done every 1-3 years.  Sexually transmitted disease (STD) testing. Follow these instructions at home: Eating and drinking  Eat a diet that includes fresh fruits and vegetables, whole grains, lean protein, and low-fat dairy products.  Take vitamin and mineral supplements as recommended by your health care provider.  Do not drink alcohol if your health care provider tells you not to drink.  If you drink alcohol: ? Limit how much you have to 0-2 drinks a day. ? Be aware of how much alcohol is in your drink. In the [place] one drink equals one 12 oz bottle of beer (355 mL), one 5 oz glass of wine (148 mL), or one 1 oz glass of hard liquor (44 mL). Lifestyle  Take daily care of your teeth and gums.  Stay active. Exercise for at least 30 minutes on 5 or more days each week.  Do not use any products that contain nicotine or tobacco, such as cigarettes, e-cigarettes, and chewing tobacco. If you need help quitting, ask your health care provider.  If you are sexually active, practice safe sex. Use a condom or other form of protection to prevent STIs (sexually transmitted infections).  Talk with your health care provider about taking a low-dose aspirin every day starting at age 58. What's next?  Go to your health care provider once a year for a well check visit.  Ask your health care provider how often you should have  your eyes and teeth checked.  Stay up to date on all vaccines. This information is not intended to replace advice given to you by your health care provider. Make sure you discuss any questions you have with your health care provider. Document Revised: 01/12/2018 Document Reviewed: 01/12/2018 Elsevier Patient Education  2020 [employer].

## 2019-04-27 NOTE — Progress Notes (Signed)
Chief Complaint:  Dale Weber is a 39 y.o. male who presents today for his annual comprehensive physical exam.    Assessment/Plan:  New/Acute Problems: Skin Lesion Shave biopsy performed today.  See below procedure note.  Tolerated well.  Chronic Problems Addressed Today: Essential hypertension Above goal.  Will start losartan-HCTZ 100-25 tablet once daily.  He will continue monitoring at home with goal 140/90 or lower.  He will follow-up in a few weeks if still not at goal.  Recent labs including C met within acceptable limits.  He will send [place] a copy of his recent labs.   Body mass index is 34.61 kg/m. / Obese BMI Metric Follow Up - 04/27/19 0829      BMI Metric Follow Up-Please document annually   BMI Metric Follow Up  Education provided        Preventative Healthcare: Recently had lab work done at outside lab.  He will give [place] a copy of this.  I was able to review these today during his office visit.  Labs significant for mildly elevated LDL, otherwise within acceptable ranges.  Patient Counseling(The following topics were reviewed and/or handout was given):  -Nutrition: Stressed importance of moderation in sodium/caffeine intake, saturated fat and cholesterol, caloric balance, sufficient intake of fresh fruits, vegetables, and fiber.  -Stressed the importance of regular exercise.   -Substance Abuse: Discussed cessation/primary prevention of tobacco, alcohol, or other drug use; driving or other dangerous activities under the influence; availability of treatment for abuse.   -Injury prevention: Discussed safety belts, safety helmets, smoke detector, smoking near bedding or upholstery.   -Sexuality: Discussed sexually transmitted diseases, partner selection, use of condoms, avoidance of unintended pregnancy and contraceptive alternatives.   -Dental health: Discussed importance of regular tooth brushing, flossing, and dental visits.  -Health maintenance and immunizations  reviewed. Please refer to Health maintenance section.  Return to care in 1 year for next preventative visit.     Subjective:  HPI:  He has no acute complaints today.   Patient was last seen over a year ago.  Since her last visit he was started on HCTZ 50 mg daily.  He has been tolerating well without side effects of still has elevated blood pressure readings into the 140s to 150s over 80s to 90s.    Lifestyle Diet: Trying to eat a clean diet.  Exercise: Trying to exercise when he can  Depression screen [facility] 2/9 04/27/2019  Decreased Interest 0  Down, Depressed, Hopeless 0  PHQ - 2 Score 0  Altered sleeping 0  Tired, decreased energy 0  Change in appetite 1  Feeling bad or failure about yourself  0  Trouble concentrating 0  Moving slowly or fidgety/restless 0  Suicidal thoughts 0  PHQ-9 Score 1  Difficult doing work/chores Not difficult at all    Health Maintenance Due  Topic Date Due  . HIV Screening  Never done  . TETANUS/TDAP  Never done     ROS: Per HPI, otherwise a complete review of systems was negative.   PMH:  The following were reviewed and entered/updated in epic: Past Medical History:  Diagnosis Date  . Hypertension   . LAP-BAND surgery status    Patient Active Problem List   Diagnosis Date Noted  . Dyslipidemia 04/27/2019  . Essential hypertension 04/01/2017   Past Surgical History:  Procedure Laterality Date  . LAPAROSCOPIC GASTRIC BANDING    . LASIK      Family History  Problem Relation Age of Onset  .  Diabetes Father   . Heart disease Father   . Diabetes Brother     Medications- reviewed and updated Current Outpatient Medications  Medication Sig Dispense Refill  . testosterone cypionate (DEPOTESTOSTERONE CYPIONATE) 200 MG/ML injection Inject into the muscle every 14 (fourteen) days.    Marland Kitchen losartan-hydrochlorothiazide (HYZAAR) 100-25 MG tablet Take 1 tablet by mouth daily. 90 tablet 3   No current facility-administered medications for  this visit.    Allergies-reviewed and updated Allergies  Allergen Reactions  . Augmentin [Amoxicillin-Pot Clavulanate] Nausea And Vomiting    Social History   Socioeconomic History  . Marital status: Married    Spouse name: Not on file  . Number of children: 5  . Years of education: Not on file  . Highest education level: Not on file  Occupational History  . Not on file  Tobacco Use  . Smoking status: Never Smoker  . Smokeless tobacco: Never Used  Substance and Sexual Activity  . Alcohol use: No  . Drug use: No  . Sexual activity: Never  Other Topics Concern  . Not on file  Social History Narrative  . Not on file   Social Determinants of Health   Financial Resource Strain:   . Difficulty of Paying Living Expenses:   Food Insecurity:   . Worried About Charity fundraiser in the Last Year:   . Arboriculturist in the Last Year:   Transportation Needs:   . Film/video editor (Medical):   Marland Kitchen Lack of Transportation (Non-Medical):   Physical Activity:   . Days of Exercise per Week:   . Minutes of Exercise per Session:   Stress:   . Feeling of Stress :   Social Connections:   . Frequency of Communication with Friends and Family:   . Frequency of Social Gatherings with Friends and Family:   . Attends Religious Services:   . Active Member of Clubs or Organizations:   . Attends Archivist Meetings:   Marland Kitchen Marital Status:         Objective:  Physical Exam: BP (!) 140/98 (BP Location: Right Arm, Patient Position: Sitting, Cuff Size: Large)   Pulse 88   Temp 97.7 F (36.5 C) (Temporal)   Ht 6' (1.829 m)   Wt 255 lb 3.2 oz (115.8 kg)   SpO2 97%   BMI 34.61 kg/m   Body mass index is 34.61 kg/m. Wt Readings from Last 3 Encounters:  04/27/19 255 lb 3.2 oz (115.8 kg)  04/01/17 252 lb 9.6 oz (114.6 kg)  09/27/12 245 lb (111.1 kg)   Gen: NAD, resting comfortably HEENT: TMs normal bilaterally. OP clear. No thyromegaly noted.  CV: RRR with no murmurs  appreciated Pulm: NWOB, CTAB with no crackles, wheezes, or rhonchi GI: Normal bowel sounds present. Soft, Nontender, Nondistended. MSK: no edema, cyanosis, or clubbing noted Skin: warm, dry.  0.5 x 1 cm pigmented lesion on left back. Neuro: CN2-12 grossly intact. Strength 5/5 in upper and lower extremities. Reflexes symmetric and intact bilaterally.  Psych: Normal affect and thought content  Shave Biopsy Procedure Note  Pre-operative Diagnosis: Suspicious lesion  Post-operative Diagnosis: same  Locations: Left back  Indications: Diagnostic  Anesthesia: 1% lidocaine with epinephrine.  Procedure Details  History of allergy to iodine: no  Patient informed of the risks (including bleeding and infection) and benefits of the  procedure and Verbal informed consent obtained.  The lesion and surrounding area were given a sterile prep using betadyne and draped in the  usual sterile fashion. A scalpel was used to shave an area of skin approximately 1cm by 1cm.  Hemostasis achieved with silver nitrate. Antibiotic ointment and a sterile dressing applied.  The specimen was sent for pathologic examination. The patient tolerated the procedure well.  EBL: 1 ml  Findings: Skin lesion  Condition: Stable  Complications: none.  Plan: 1. Instructed to keep the wound dry and covered for 24-48h and clean thereafter. 2. Warning signs of infection were reviewed.   3. Recommended that the patient use OTC analgesics and OTC ibuprofen as needed for pain.       Algis Greenhouse. [name], MD 04/27/2019 9:20 AM

## 2019-05-01 NOTE — Progress Notes (Signed)
Please inform patient of the following:  His biopsy showed a dysplastic nevus. This is typically a benign lesion, though the specialists recommend full excision to make sure it does not turn into melanoma. I am happy to do that for him here or we can refer him to see a dermatologist.  Dale Weber. [name], MD 05/01/2019 1:21 PM

## 2020-01-17 ENCOUNTER — Telehealth (INDEPENDENT_AMBULATORY_CARE_PROVIDER_SITE_OTHER): Payer: PRIVATE HEALTH INSURANCE | Admitting: Family Medicine

## 2020-01-17 ENCOUNTER — Encounter: Payer: Self-pay | Admitting: Family Medicine

## 2020-01-17 DIAGNOSIS — R7989 Other specified abnormal findings of blood chemistry: Secondary | ICD-10-CM | POA: Diagnosis not present

## 2020-01-17 MED ORDER — TESTOSTERONE CYPIONATE 200 MG/ML IM SOLN: 150.0000 mg | INTRAMUSCULAR | 0 refills | Status: DC

## 2020-01-17 NOTE — Progress Notes (Signed)
   Dale Weber is a 39 y.o. male who presents today for a telephone visit.  Assessment/Plan:  Chronic Problems Addressed Today: Low testosterone Database with no red flags.  Able see previous prescription for testosterone on registry.  Will refill today 150 mg weekly.  Will need to recheck labs including CBC, PSA, testosterone in about 3 months.  He will come in soon for CPE and we can repeat labs at that point.     Subjective:  HPI:  Patient would like to discuss testosterone.  He has been on for last couple years.  He has been seeing a testosterone clinic and has been taking 150 mg total weekly of testosterone cypionate.  He has noticed significant improvement in energy level since starting.  No reported side effects.  Is reported to be getting blood work done every 3 months and have been within normal ranges.  Testosterone levels have been in the 800 range.        Objective/Observations   NAD  Telephone Visit   I connected with Dale Weber on 01/17/20 at  4:00 PM EST via telephone and verified that I am speaking with the correct person using two identifiers. I discussed the limitations of evaluation and management by telemedicine and the availability of in person appointments. The patient expressed understanding and agreed to proceed.   Patient location: Home Provider location: [place] participating in the virtual visit: Myself and Patient  A total of 16 minutes were spent on medical discussion.      Dale Weber. [name], MD 01/17/2020 4:23 PM

## 2020-01-17 NOTE — Assessment & Plan Note (Signed)
Database with no red flags.  Able see previous prescription for testosterone on registry.  Will refill today 150 mg weekly.  Will need to recheck labs including CBC, PSA, testosterone in about 3 months.  He will come in soon for CPE and we can repeat labs at that point.

## 2020-02-11 ENCOUNTER — Other Ambulatory Visit: Payer: Self-pay | Admitting: Family Medicine

## 2020-02-12 NOTE — Telephone Encounter (Signed)
Please see message from pharmacy and advise if okay to send separate Rx's?

## 2020-02-12 NOTE — Telephone Encounter (Signed)
Ok to send in as separate rx.  Algis Greenhouse. [name], MD 02/12/2020 2:13 PM

## 2020-05-22 ENCOUNTER — Other Ambulatory Visit: Payer: Self-pay | Admitting: Family Medicine

## 2020-05-27 ENCOUNTER — Ambulatory Visit (INDEPENDENT_AMBULATORY_CARE_PROVIDER_SITE_OTHER): Payer: PRIVATE HEALTH INSURANCE | Admitting: Family Medicine

## 2020-05-27 ENCOUNTER — Other Ambulatory Visit: Payer: Self-pay

## 2020-05-27 ENCOUNTER — Encounter: Payer: Self-pay | Admitting: Family Medicine

## 2020-05-27 VITALS — BP 127/81 | HR 75 | Temp 97.3°F | Ht 72.0 in | Wt 256.2 lb

## 2020-05-27 DIAGNOSIS — R7989 Other specified abnormal findings of blood chemistry: Secondary | ICD-10-CM

## 2020-05-27 DIAGNOSIS — E669 Obesity, unspecified: Secondary | ICD-10-CM

## 2020-05-27 DIAGNOSIS — Z0001 Encounter for general adult medical examination with abnormal findings: Secondary | ICD-10-CM

## 2020-05-27 DIAGNOSIS — Z6834 Body mass index (BMI) 34.0-34.9, adult: Secondary | ICD-10-CM

## 2020-05-27 DIAGNOSIS — R739 Hyperglycemia, unspecified: Secondary | ICD-10-CM

## 2020-05-27 DIAGNOSIS — E785 Hyperlipidemia, unspecified: Secondary | ICD-10-CM

## 2020-05-27 DIAGNOSIS — I10 Essential (primary) hypertension: Secondary | ICD-10-CM

## 2020-05-27 MED ORDER — TESTOSTERONE CYPIONATE 200 MG/ML IM SOLN: 150.0000 mg | INTRAMUSCULAR | 0 refills | Status: DC

## 2020-05-27 NOTE — Assessment & Plan Note (Signed)
At goal on losartan 100 mg daily.  Check labs today. 

## 2020-05-27 NOTE — Progress Notes (Signed)
Chief Complaint:  Dale Weber is a 40 y.o. male who presents today for his annual comprehensive physical exam.    Assessment/Plan:  Chronic Problems Addressed Today: Low testosterone Check testosterone and other labs today.  Continue testosterone supplementation 150 mg weekly.  Dyslipidemia Check labs.  Discussed lifestyle modifications.  Essential hypertension At goal on losartan 100mg  daily.  Check labs today.   Body mass index is 34.75 kg/m. / Obese  BMI Metric Follow Up - 05/27/20 1510      BMI Metric Follow Up-Please document annually   BMI Metric Follow Up Education provided            Preventative Healthcare: Check labs.  Due for colon cancer screening at age 19.  Patient Counseling(The following topics were reviewed and/or handout was given):  -Nutrition: Stressed importance of moderation in sodium/caffeine intake, saturated fat and cholesterol, caloric balance, sufficient intake of fresh fruits, vegetables, and fiber.  -Stressed the importance of regular exercise.   -Substance Abuse: Discussed cessation/primary prevention of tobacco, alcohol, or other drug use; driving or other dangerous activities under the influence; availability of treatment for abuse.   -Injury prevention: Discussed safety belts, safety helmets, smoke detector, smoking near bedding or upholstery.   -Sexuality: Discussed sexually transmitted diseases, partner selection, use of condoms, avoidance of unintended pregnancy and contraceptive alternatives.   -Dental health: Discussed importance of regular tooth brushing, flossing, and dental visits.  -Health maintenance and immunizations reviewed. Please refer to Health maintenance section.  Return to care in 1 year for next preventative visit.     Subjective:  HPI:  He has no acute complaints today.   Lifestyle Diet: Balanced. Plenty of fruits and vegetables.  Exercise: Gym 5-6 times per week. Weight training and aerobics.   Depression  screen [facility] 2/9 05/27/2020  Decreased Interest 0  Down, Depressed, Hopeless 0  PHQ - 2 Score 0  Altered sleeping -  Tired, decreased energy -  Change in appetite -  Feeling bad or failure about yourself  -  Trouble concentrating -  Moving slowly or fidgety/restless -  Suicidal thoughts -  PHQ-9 Score -  Difficult doing work/chores -    Health Maintenance Due  Topic Date Due  . Hepatitis C Screening  Never done  . HIV Screening  Never done     ROS: Per HPI, otherwise a complete review of systems was negative.   PMH:  The following were reviewed and entered/updated in epic: Past Medical History:  Diagnosis Date  . Hypertension   . LAP-BAND surgery status    Patient Active Problem List   Diagnosis Date Noted  . Low testosterone 01/17/2020  . Dyslipidemia 04/27/2019  . Essential hypertension 04/01/2017   Past Surgical History:  Procedure Laterality Date  . LAPAROSCOPIC GASTRIC BANDING    . LASIK      Family History  Problem Relation Age of Onset  . Diabetes Father   . Heart disease Father   . Diabetes Brother     Medications- reviewed and updated Current Outpatient Medications  Medication Sig Dispense Refill  . losartan (COZAAR) 100 MG tablet TAKE 1 TABLET BY MOUTH EVERY DAY 90 tablet 1  . testosterone cypionate (DEPOTESTOSTERONE CYPIONATE) 200 MG/ML injection Inject 0.75 mLs (150 mg total) into the muscle once a week. 10 mL 0   No current facility-administered medications for this visit.    Allergies-reviewed and updated Allergies  Allergen Reactions  . Augmentin [Amoxicillin-Pot Clavulanate] Nausea And Vomiting    Social History  Socioeconomic History  . Marital status: Married    Spouse name: Not on file  . Number of children: 5  . Years of education: Not on file  . Highest education level: Not on file  Occupational History  . Not on file  Tobacco Use  . Smoking status: Never Smoker  . Smokeless tobacco: Never Used  Substance and Sexual  Activity  . Alcohol use: No  . Drug use: No  . Sexual activity: Never  Other Topics Concern  . Not on file  Social History Narrative  . Not on file   Social Determinants of Health   Financial Resource Strain: Not on file  Food Insecurity: Not on file  Transportation Needs: Not on file  Physical Activity: Not on file  Stress: Not on file  Social Connections: Not on file        Objective:  Physical Exam: BP 127/81   Pulse 75   Temp (!) 97.3 F (36.3 C) (Temporal)   Ht 6' (1.829 m)   Wt 256 lb 3.2 oz (116.2 kg)   SpO2 98%   BMI 34.75 kg/m   Body mass index is 34.75 kg/m. Wt Readings from Last 3 Encounters:  05/27/20 256 lb 3.2 oz (116.2 kg)  01/17/20 240 lb (108.9 kg)  04/27/19 255 lb 3.2 oz (115.8 kg)   Gen: NAD, resting comfortably HEENT: TMs normal bilaterally. OP clear. No thyromegaly noted.  CV: RRR with no murmurs appreciated Pulm: NWOB, CTAB with no crackles, wheezes, or rhonchi GI: Normal bowel sounds present. Soft, Nontender, Nondistended. MSK: no edema, cyanosis, or clubbing noted Skin: warm, dry Neuro: CN2-12 grossly intact. Strength 5/5 in upper and lower extremities. Reflexes symmetric and intact bilaterally.  Psych: Normal affect and thought content     Makyiah Lie M. [name], MD 05/27/2020 3:10 PM

## 2020-05-27 NOTE — Assessment & Plan Note (Signed)
Check labs.  Discussed lifestyle modifications. 

## 2020-05-27 NOTE — Patient Instructions (Signed)
It was very nice to see you today!  We will check blood work today.  Keep up the good work with your diet and exercise.  I will see back in year.  Come back to see me sooner if needed.  Take care, Dr [name]  PLEASE NOTE:  If you had any lab tests please let us know if you have not heard back within a few days. You may see your results on mychart before we have a chance to review them but we will give you a call once they are reviewed by [place]. If we ordered any referrals today, please let us know if you have not heard from their office within the next week.   Please try these tips to maintain a healthy lifestyle:   Eat at least 3 REAL meals and 1-2 snacks per day.  Aim for no more than 5 hours between eating.  If you eat breakfast, please do so within one hour of getting up.    Each meal should contain half fruits/vegetables, one quarter protein, and one quarter carbs (no bigger than a computer mouse)   Cut down on sweet beverages. This includes juice, soda, and sweet tea.     Drink at least 1 glass of water with each meal and aim for at least 8 glasses per day   Exercise at least 150 minutes every week.    Preventive Care 20-76 Years Old, Male Preventive care refers to lifestyle choices and visits with your health care provider that can promote health and wellness. This includes:  A yearly physical exam. This is also called an annual wellness visit.  Regular dental and eye exams.  Immunizations.  Screening for certain conditions.  Healthy lifestyle choices, such as: ? Eating a healthy diet. ? Getting regular exercise. ? Not using drugs or products that contain nicotine and tobacco. ? Limiting alcohol use. What can I expect for my preventive care visit? Physical exam Your health care provider will check your:  Height and weight. These may be used to calculate your BMI (body mass index). BMI is a measurement that tells if you are at a healthy weight.  Heart rate and  blood pressure.  Body temperature.  Skin for abnormal spots. Counseling Your health care provider may ask you questions about your:  Past medical problems.  Family's medical history.  Alcohol, tobacco, and drug use.  Emotional well-being.  Home life and relationship well-being.  Sexual activity.  Diet, exercise, and sleep habits.  Work and work Statistician.  Access to firearms. What immunizations do I need? Vaccines are usually given at various ages, according to a schedule. Your health care provider will recommend vaccines for you based on your age, medical history, and lifestyle or other factors, such as travel or where you work.   What tests do I need? Blood tests  Lipid and cholesterol levels. These may be checked every 5 years, or more often if you are over 61 years old.  Hepatitis C test.  Hepatitis B test. Screening  Lung cancer screening. You may have this screening every year starting at age 84 if you have a 30-pack-year history of smoking and currently smoke or have quit within the past 15 years.  Prostate cancer screening. Recommendations will vary depending on your family history and other risks.  Genital exam to check for testicular cancer or hernias.  Colorectal cancer screening. ? All adults should have this screening starting at age 8 and continuing until age 79. ? Your  health care provider may recommend screening at age 66 if you are at increased risk. ? You will have tests every 1-10 years, depending on your results and the type of screening test.  Diabetes screening. ? This is done by checking your blood sugar (glucose) after you have not eaten for a while (fasting). ? You may have this done every 1-3 years.  STD (sexually transmitted disease) testing, if you are at risk. Follow these instructions at home: Eating and drinking  Eat a diet that includes fresh fruits and vegetables, whole grains, lean protein, and low-fat dairy  products.  Take vitamin and mineral supplements as recommended by your health care provider.  Do not drink alcohol if your health care provider tells you not to drink.  If you drink alcohol: ? Limit how much you have to 0-2 drinks a day. ? Be aware of how much alcohol is in your drink. In the [place] one drink equals one 12 oz bottle of beer (355 mL), one 5 oz glass of wine (148 mL), or one 1 oz glass of hard liquor (44 mL).   Lifestyle  Take daily care of your teeth and gums. Brush your teeth every morning and night with fluoride toothpaste. Floss one time each day.  Stay active. Exercise for at least 30 minutes 5 or more days each week.  Do not use any products that contain nicotine or tobacco, such as cigarettes, e-cigarettes, and chewing tobacco. If you need help quitting, ask your health care provider.  Do not use drugs.  If you are sexually active, practice safe sex. Use a condom or other form of protection to prevent STIs (sexually transmitted infections).  If told by your health care provider, take low-dose aspirin daily starting at age 36.  Find healthy ways to cope with stress, such as: ? Meditation, yoga, or listening to music. ? Journaling. ? Talking to a trusted person. ? Spending time with friends and family. Safety  Always wear your seat belt while driving or riding in a vehicle.  Do not drive: ? If you have been drinking alcohol. Do not ride with someone who has been drinking. ? When you are tired or distracted. ? While texting.  Wear a helmet and other protective equipment during sports activities.  If you have firearms in your house, make sure you follow all gun safety procedures. What's next?  Go to your health care provider once a year for an annual wellness visit.  Ask your health care provider how often you should have your eyes and teeth checked.  Stay up to date on all vaccines. This information is not intended to replace advice given to you by  your health care provider. Make sure you discuss any questions you have with your health care provider. Document Revised: 10/17/2018 Document Reviewed: 01/12/2018 Elsevier Patient Education  2021 [employer].

## 2020-05-27 NOTE — Assessment & Plan Note (Signed)
Check testosterone and other labs today.  Continue testosterone supplementation 150 mg weekly.

## 2020-05-28 LAB — TSH: TSH: 1.73 u[IU]/mL (ref 0.35–4.50)

## 2020-05-28 LAB — COMPREHENSIVE METABOLIC PANEL
ALT: 26 U/L (ref 0–53)
AST: 25 U/L (ref 0–37)
Albumin: 4.1 g/dL (ref 3.5–5.2)
Alkaline Phosphatase: 65 U/L (ref 39–117)
BUN: 18 mg/dL (ref 6–23)
CO2: 26 mEq/L (ref 19–32)
Calcium: 9.3 mg/dL (ref 8.4–10.5)
Chloride: 100 mEq/L (ref 96–112)
Creatinine, Ser: 1.39 mg/dL (ref 0.40–1.50)
GFR: 63.04 mL/min (ref >60.00)
Glucose, Bld: 82 mg/dL (ref 70–99)
Potassium: 4.3 mEq/L (ref 3.5–5.1)
Sodium: 138 mEq/L (ref 135–145)
Total Bilirubin: 0.5 mg/dL (ref 0.2–1.2)
Total Protein: 6.8 g/dL (ref 6.0–8.3)

## 2020-05-28 LAB — PSA: PSA: 0.78 ng/mL (ref 0.10–4.00)

## 2020-05-28 LAB — CBC
HCT: 46.5 % (ref 39.0–52.0)
Hemoglobin: 16.2 g/dL (ref 13.0–17.0)
MCHC: 34.9 g/dL (ref 30.0–36.0)
MCV: 85.8 fl (ref 78.0–100.0)
Platelets: 276 10*3/uL (ref 150.0–400.0)
RBC: 5.42 Mil/uL (ref 4.22–5.81)
RDW: 13.6 % (ref 11.5–15.5)
WBC: 8.5 10*3/uL (ref 4.0–10.5)

## 2020-05-28 LAB — HEMOGLOBIN A1C: Hgb A1c MFr Bld: 5.8 % (ref 4.6–6.5)

## 2020-05-28 LAB — LIPID PANEL
Cholesterol: 203 mg/dL — ABNORMAL HIGH (ref 0–200)
HDL: 37.2 mg/dL — ABNORMAL LOW (ref >39.00)
Total CHOL/HDL Ratio: 5
Triglycerides: 526 mg/dL — ABNORMAL HIGH (ref 0.0–149.0)

## 2020-05-28 LAB — LDL CHOLESTEROL, DIRECT: Direct LDL: 138 mg/dL

## 2020-05-28 LAB — TESTOSTERONE: Testosterone: 595.75 ng/dL (ref 300.00–890.00)

## 2020-05-29 ENCOUNTER — Telehealth: Payer: Self-pay | Admitting: *Deleted

## 2020-05-29 ENCOUNTER — Encounter: Payer: Self-pay | Admitting: Family Medicine

## 2020-05-29 DIAGNOSIS — R739 Hyperglycemia, unspecified: Secondary | ICD-10-CM | POA: Insufficient documentation

## 2020-05-29 NOTE — Telephone Encounter (Signed)
Drug Testosterone Cypionate 200MG /ML intramuscular solution Form [facility] Electronic Prior Authorization Form (2017 NCPDP) Original Claim Info 75 Drug Requires Prior Authorization  Key: [id number] - [place] Case ID: [id number] - Rx #: [id number] Waiting for determination

## 2020-05-29 NOTE — Progress Notes (Signed)
Please inform patient of the following:  His triglycerides are little high but he was not fasting.  I am not overly concerned with this value.  His cholesterol is also a little bit elevated as was his blood sugar.  He is not at the point where he needs to start medications for any of these.  He should continue working on diet and exercise and we can recheck in year.  All of his other blood work including his testosterone level was norma. we can recheck this in a year as well.

## 2020-06-03 NOTE — Telephone Encounter (Signed)
Left voice message for patient to call clinic.  

## 2020-06-03 NOTE — Telephone Encounter (Signed)
Deniedon May 2 Request Reference Number: [id number]. TESTOST CYP INJ 200MG /ML is denied for not meeting the prior authorization requirement(s). Details of this decision are in the notice attached below or have been faxed to you. Appeals are not supported through [place]. Please refer to the fax case notice for appeals information and instructions.

## 2020-06-03 NOTE — Telephone Encounter (Signed)
Please let patient know. Are there any recommended alternatives?  Dale Weber. [name], MD 06/03/2020 9:24 AM

## 2020-06-05 NOTE — Telephone Encounter (Signed)
Left voice message for patient to call clinic.  

## 2020-06-10 ENCOUNTER — Telehealth: Payer: Self-pay | Admitting: *Deleted

## 2020-06-10 NOTE — Telephone Encounter (Signed)
Left voice message with information.

## 2020-06-10 NOTE — Telephone Encounter (Signed)
Patient call stated base on his testosterone level  Iit is ok to increase testosterone inj to 0.5 twice per week ok to leave message on voice mail

## 2020-06-10 NOTE — Telephone Encounter (Signed)
Patient stated Rx not to expensive will continue with Tx

## 2020-06-10 NOTE — Telephone Encounter (Signed)
That is fine to slightly increase to 4mL or 200mg  total weekly.  We should have him come back in 3 months to recheck testosterone if we are making a dose change.  Dale Weber. [name], MD 06/10/2020 3:00 PM

## 2020-08-07 ENCOUNTER — Other Ambulatory Visit: Payer: Self-pay | Admitting: Family Medicine

## 2020-09-11 ENCOUNTER — Other Ambulatory Visit: Payer: Self-pay | Admitting: Family Medicine

## 2020-11-21 ENCOUNTER — Other Ambulatory Visit: Payer: Self-pay | Admitting: Family Medicine

## 2020-11-25 ENCOUNTER — Other Ambulatory Visit: Payer: Self-pay | Admitting: Family Medicine

## 2021-02-04 ENCOUNTER — Other Ambulatory Visit: Payer: Self-pay | Admitting: Family Medicine

## 2021-05-12 ENCOUNTER — Other Ambulatory Visit: Payer: Self-pay | Admitting: Family Medicine

## 2021-05-12 NOTE — Telephone Encounter (Signed)
Last visit: 05/27/20 ? ?Next visit: 05/29/21 ? ?Last filled: 02/05/21 ? ?Quantity: 10 ml ? ?

## 2021-05-27 ENCOUNTER — Other Ambulatory Visit: Payer: Self-pay | Admitting: Family Medicine

## 2021-05-29 ENCOUNTER — Encounter: Payer: Self-pay | Admitting: Family Medicine

## 2021-05-29 ENCOUNTER — Ambulatory Visit (INDEPENDENT_AMBULATORY_CARE_PROVIDER_SITE_OTHER): Payer: PRIVATE HEALTH INSURANCE | Admitting: Family Medicine

## 2021-05-29 VITALS — BP 126/80 | HR 78 | Temp 98.3°F | Ht 72.0 in | Wt 243.8 lb

## 2021-05-29 DIAGNOSIS — R7989 Other specified abnormal findings of blood chemistry: Secondary | ICD-10-CM | POA: Diagnosis not present

## 2021-05-29 DIAGNOSIS — R739 Hyperglycemia, unspecified: Secondary | ICD-10-CM | POA: Diagnosis not present

## 2021-05-29 DIAGNOSIS — E785 Hyperlipidemia, unspecified: Secondary | ICD-10-CM

## 2021-05-29 DIAGNOSIS — Z23 Encounter for immunization: Secondary | ICD-10-CM

## 2021-05-29 DIAGNOSIS — Z0001 Encounter for general adult medical examination with abnormal findings: Secondary | ICD-10-CM

## 2021-05-29 DIAGNOSIS — I10 Essential (primary) hypertension: Secondary | ICD-10-CM

## 2021-05-29 LAB — LIPID PANEL
Cholesterol: 193 mg/dL (ref 0–200)
HDL: 44.1 mg/dL (ref >39.00)
LDL Cholesterol: 131 mg/dL — ABNORMAL HIGH (ref 0–99)
NonHDL: 148.68
Total CHOL/HDL Ratio: 4
Triglycerides: 90 mg/dL (ref 0.0–149.0)
VLDL: 18 mg/dL (ref 0.0–40.0)

## 2021-05-29 LAB — COMPREHENSIVE METABOLIC PANEL
ALT: 25 U/L (ref 0–53)
AST: 24 U/L (ref 0–37)
Albumin: 4.1 g/dL (ref 3.5–5.2)
Alkaline Phosphatase: 52 U/L (ref 39–117)
BUN: 17 mg/dL (ref 6–23)
CO2: 29 mEq/L (ref 19–32)
Calcium: 8.8 mg/dL (ref 8.4–10.5)
Chloride: 102 mEq/L (ref 96–112)
Creatinine, Ser: 1.36 mg/dL (ref 0.40–1.50)
GFR: 64.26 mL/min (ref >60.00)
Glucose, Bld: 89 mg/dL (ref 70–99)
Potassium: 4.3 mEq/L (ref 3.5–5.1)
Sodium: 138 mEq/L (ref 135–145)
Total Bilirubin: 0.6 mg/dL (ref 0.2–1.2)
Total Protein: 6.6 g/dL (ref 6.0–8.3)

## 2021-05-29 LAB — CBC
HCT: 49.4 % (ref 39.0–52.0)
Hemoglobin: 16.6 g/dL (ref 13.0–17.0)
MCHC: 33.7 g/dL (ref 30.0–36.0)
MCV: 85.7 fl (ref 78.0–100.0)
Platelets: 243 10*3/uL (ref 150.0–400.0)
RBC: 5.77 Mil/uL (ref 4.22–5.81)
RDW: 13.1 % (ref 11.5–15.5)
WBC: 6.7 10*3/uL (ref 4.0–10.5)

## 2021-05-29 LAB — TESTOSTERONE: Testosterone: 1211.8 ng/dL — ABNORMAL HIGH (ref 300.00–890.00)

## 2021-05-29 LAB — HEMOGLOBIN A1C: Hgb A1c MFr Bld: 5.5 % (ref 4.6–6.5)

## 2021-05-29 LAB — TSH: TSH: 2.17 u[IU]/mL (ref 0.35–5.50)

## 2021-05-29 LAB — PSA: PSA: 0.6 ng/mL (ref 0.10–4.00)

## 2021-05-29 MED ORDER — TESTOSTERONE CYPIONATE 200 MG/ML IM SOLN: 200.0000 mg | INTRAMUSCULAR | 0 refills | Status: DC

## 2021-05-29 NOTE — Progress Notes (Signed)
? ?Chief Complaint:  ?Dale Weber is a 41 y.o. male who presents today for his annual comprehensive physical exam.   ? ?Assessment/Plan:  ?Chronic Problems Addressed Today: ?Hyperglycemia ?Check A1c.  ? ?Low testosterone ?He is on testosterone 200 mg weekly.  We will check testosterone level today.  His last injection was 2 days ago. ? ?Dyslipidemia ?  Discussed lifestyle modifications.  Check lipids. ? ?Essential hypertension ?At goal today on losartan 100 mg daily.  We discussed lifestyle modifications.  Check labs. ? ?Preventative Healthcare: ?Tdap given today. Check labs.  ? ?Patient Counseling(The following topics were reviewed and/or handout was given): ? -Nutrition: Stressed importance of moderation in sodium/caffeine intake, saturated fat and cholesterol, caloric balance, sufficient intake of fresh fruits, vegetables, and fiber. ? -Stressed the importance of regular exercise.  ? -Substance Abuse: Discussed cessation/primary prevention of tobacco, alcohol, or other drug use; driving or other dangerous activities under the influence; availability of treatment for abuse.  ? -Injury prevention: Discussed safety belts, safety helmets, smoke detector, smoking near bedding or upholstery.  ? -Sexuality: Discussed sexually transmitted diseases, partner selection, use of condoms, avoidance of unintended pregnancy and contraceptive alternatives.  ? -Dental health: Discussed importance of regular tooth brushing, flossing, and dental visits. ? -Health maintenance and immunizations reviewed. Please refer to Health maintenance section. ? ?Return to care in 1 year for next preventative visit.  ? ?  ?Subjective:  ?HPI: ? ?He has no acute complaints today.  ? ?Lifestyle ?Diet: Balanced. Plenty of fruits and vegetables.  ?Exercise: Likes lifting weight 6 days per week.  ? ? ?  05/27/2020  ?  2:47 PM  ?Depression screen PHQ 2/9  ?Decreased Interest 0  ?Down, Depressed, Hopeless 0  ?PHQ - 2 Score 0  ? ? ?Health Maintenance Due   ?Topic Date Due  ? HIV Screening  Never done  ? Hepatitis C Screening  Never done  ? TETANUS/TDAP  Never done  ? COVID-19 Vaccine (4 - Booster for Pfizer series) 06/02/2020  ?  ? ?ROS: Per HPI, otherwise a complete review of systems was negative.  ? ?PMH: ? ?The following were reviewed and entered/updated in epic: ?Past Medical History:  ?Diagnosis Date  ? Hypertension   ? LAP-BAND surgery status   ? ?Patient Active Problem List  ? Diagnosis Date Noted  ? Hyperglycemia 05/29/2020  ? Low testosterone 01/17/2020  ? Dyslipidemia 04/27/2019  ? Essential hypertension 04/01/2017  ? ?Past Surgical History:  ?Procedure Laterality Date  ? LAPAROSCOPIC GASTRIC BANDING    ? LASIK    ? ? ?Family History  ?Problem Relation Age of Onset  ? Diabetes Father   ? Heart disease Father   ? Diabetes Brother   ? ? ?Medications- reviewed and updated ?Current Outpatient Medications  ?Medication Sig Dispense Refill  ? losartan (COZAAR) 100 MG tablet TAKE 1 TABLET BY MOUTH EVERY DAY 90 tablet 1  ? testosterone cypionate (DEPOTESTOSTERONE CYPIONATE) 200 MG/ML injection Inject 1 mL (200 mg total) into the muscle once a week. 10 mL 0  ? ?No current facility-administered medications for this visit.  ? ? ?Allergies-reviewed and updated ?Allergies  ?Allergen Reactions  ? Augmentin [Amoxicillin-Pot Clavulanate] Nausea And Vomiting  ? ? ?Social History  ? ?Socioeconomic History  ? Marital status: Married  ?  Spouse name: Not on file  ? Number of children: 5  ? Years of education: Not on file  ? Highest education level: Not on file  ?Occupational History  ? Not on file  ?Tobacco  Use  ? Smoking status: Never  ? Smokeless tobacco: Never  ?Substance and Sexual Activity  ? Alcohol use: No  ? Drug use: No  ? Sexual activity: Never  ?Other Topics Concern  ? Not on file  ?Social History Narrative  ? Not on file  ? ?Social Determinants of Health  ? ?Financial Resource Strain: Not on file  ?Food Insecurity: Not on file  ?Transportation Needs: Not on file   ?Physical Activity: Not on file  ?Stress: Not on file  ?Social Connections: Not on file  ? ?   ?  ?Objective:  ?Physical Exam: ?BP 126/80   Pulse 78   Temp 98.3 ?F (36.8 ?C)   Ht 6' (1.829 m)   Wt 243 lb 12.8 oz (110.6 kg)   SpO2 96%   BMI 33.07 kg/m?   ?Body mass index is 33.07 kg/m?. ?Wt Readings from Last 3 Encounters:  ?05/29/21 243 lb 12.8 oz (110.6 kg)  ?05/27/20 256 lb 3.2 oz (116.2 kg)  ?01/17/20 240 lb (108.9 kg)  ? ?Gen: NAD, resting comfortably ?HEENT: TMs normal bilaterally. OP clear. No thyromegaly noted.  ?CV: RRR with no murmurs appreciated ?Pulm: NWOB, CTAB with no crackles, wheezes, or rhonchi ?GI: Normal bowel sounds present. Soft, Nontender, Nondistended. ?MSK: no edema, cyanosis, or clubbing noted ?Skin: warm, dry ?Neuro: CN2-12 grossly intact. Strength 5/5 in upper and lower extremities. Reflexes symmetric and intact bilaterally.  ?Psych: Normal affect and thought content ?   ? ?Algis Greenhouse. [name], MD ?05/29/2021 8:27 AM  ?

## 2021-05-29 NOTE — Assessment & Plan Note (Signed)
Discussed lifestyle modifications. Check lipids.  ?

## 2021-05-29 NOTE — Assessment & Plan Note (Signed)
At goal today on losartan 100 mg daily.  We discussed lifestyle modifications.  Check labs. ?

## 2021-05-29 NOTE — Assessment & Plan Note (Signed)
Check A1c. 

## 2021-05-29 NOTE — Assessment & Plan Note (Signed)
He is on testosterone 200 mg weekly.  We will check testosterone level today.  His last injection was 2 days ago. ?

## 2021-05-29 NOTE — Patient Instructions (Signed)
It was very nice to see you today! ? ?Keep up the good work! ? ?We will check blood work today. ? ?Please come back to see me in 1 year for your next physical.  Come back sooner if needed. ? ?Take care, ?Dr [name] ? ?PLEASE NOTE: ? ?If you had any lab tests please let us know if you have not heard back within a few days. You may see your results on mychart before we have a chance to review them but we will give you a call once they are reviewed by [place]. If we ordered any referrals today, please let us know if you have not heard from their office within the next week.  ? ?Please try these tips to maintain a healthy lifestyle: ? ?Eat at least 3 REAL meals and 1-2 snacks per day.  Aim for no more than 5 hours between eating.  If you eat breakfast, please do so within one hour of getting up.  ? ?Each meal should contain half fruits/vegetables, one quarter protein, and one quarter carbs (no bigger than a computer mouse) ? ?Cut down on sweet beverages. This includes juice, soda, and sweet tea.  ? ?Drink at least 1 glass of water with each meal and aim for at least 8 glasses per day ? ?Exercise at least 150 minutes every week.   ? ?Preventive Care 33-15 Years Old, Male ?Preventive care refers to lifestyle choices and visits with your health care provider that can promote health and wellness. Preventive care visits are also called wellness exams. ?What can I expect for my preventive care visit? ?Counseling ?During your preventive care visit, your health care provider may ask about your: ?Medical history, including: ?Past medical problems. ?Family medical history. ?Current health, including: ?Emotional well-being. ?Home life and relationship well-being. ?Sexual activity. ?Lifestyle, including: ?Alcohol, nicotine or tobacco, and drug use. ?Access to firearms. ?Diet, exercise, and sleep habits. ?Safety issues such as seatbelt and bike helmet use. ?Sunscreen use. ?Work and work Statistician. ?Physical exam ?Your health care  provider will check your: ?Height and weight. These may be used to calculate your BMI (body mass index). BMI is a measurement that tells if you are at a healthy weight. ?Waist circumference. This measures the distance around your waistline. This measurement also tells if you are at a healthy weight and may help predict your risk of certain diseases, such as type 2 diabetes and high blood pressure. ?Heart rate and blood pressure. ?Body temperature. ?Skin for abnormal spots. ?What immunizations do I need? ? ?Vaccines are usually given at various ages, according to a schedule. Your health care provider will recommend vaccines for you based on your age, medical history, and lifestyle or other factors, such as travel or where you work. ?What tests do I need? ?Screening ?Your health care provider may recommend screening tests for certain conditions. This may include: ?Lipid and cholesterol levels. ?Diabetes screening. This is done by checking your blood sugar (glucose) after you have not eaten for a while (fasting). ?Hepatitis B test. ?Hepatitis C test. ?HIV (human immunodeficiency virus) test. ?STI (sexually transmitted infection) testing, if you are at risk. ?Lung cancer screening. ?Prostate cancer screening. ?Colorectal cancer screening. ?Talk with your health care provider about your test results, treatment options, and if necessary, the need for more tests. ?Follow these instructions at home: ?Eating and drinking ? ?Eat a diet that includes fresh fruits and vegetables, whole grains, lean protein, and low-fat dairy products. ?Take vitamin and mineral supplements as recommended by  your health care provider. ?Do not drink alcohol if your health care provider tells you not to drink. ?If you drink alcohol: ?Limit how much you have to 0-2 drinks a day. ?Know how much alcohol is in your drink. In the [place] one drink equals one 12 oz bottle of beer (355 mL), one 5 oz glass of wine (148 mL), or one 1? oz glass of hard liquor  (44 mL). ?Lifestyle ?Brush your teeth every morning and night with fluoride toothpaste. Floss one time each day. ?Exercise for at least 30 minutes 5 or more days each week. ?Do not use any products that contain nicotine or tobacco. These products include cigarettes, chewing tobacco, and vaping devices, such as e-cigarettes. If you need help quitting, ask your health care provider. ?Do not use drugs. ?If you are sexually active, practice safe sex. Use a condom or other form of protection to prevent STIs. ?Take aspirin only as told by your health care provider. Make sure that you understand how much to take and what form to take. Work with your health care provider to find out whether it is safe and beneficial for you to take aspirin daily. ?Find healthy ways to manage stress, such as: ?Meditation, yoga, or listening to music. ?Journaling. ?Talking to a trusted person. ?Spending time with friends and family. ?Minimize exposure to UV radiation to reduce your risk of skin cancer. ?Safety ?Always wear your seat belt while driving or riding in a vehicle. ?Do not drive: ?If you have been drinking alcohol. Do not ride with someone who has been drinking. ?When you are tired or distracted. ?While texting. ?If you have been using any mind-altering substances or drugs. ?Wear a helmet and other protective equipment during sports activities. ?If you have firearms in your house, make sure you follow all gun safety procedures. ?What's next? ?Go to your health care provider once a year for an annual wellness visit. ?Ask your health care provider how often you should have your eyes and teeth checked. ?Stay up to date on all vaccines. ?This information is not intended to replace advice given to you by your health care provider. Make sure you discuss any questions you have with your health care provider. ?Document Revised: 07/16/2020 Document Reviewed: 07/16/2020 ?Elsevier Patient Education ? [place]. ? ?

## 2021-06-02 NOTE — Progress Notes (Signed)
Please inform patient of the following: ? ?Testosterone is up a bit.  Please make sure he is taking as prescribed and have him come back in for a midcycle redraw. ? ?His LDL is up a bit but over all stable.  He should continue to work on diet and exercise and we can recheck in a year. ? ?Everything else is stable. ? ?

## 2021-06-04 NOTE — Progress Notes (Signed)
He can come back any time to recheck testosterone. We need to make sure that he is mid cycle when drawing his testosterone levels.

## 2021-06-16 ENCOUNTER — Other Ambulatory Visit: Payer: Self-pay | Admitting: *Deleted

## 2021-06-16 DIAGNOSIS — R7989 Other specified abnormal findings of blood chemistry: Secondary | ICD-10-CM

## 2021-06-16 NOTE — Telephone Encounter (Signed)
Please schedule appt with lab at mid point between his injections  ?Thanks  ?

## 2021-06-16 NOTE — Telephone Encounter (Signed)
Pt states he was told to make a lab appointment "mid-cycle".  ? ?No active requests for labs available as of 06/16/21. ? ?Previous documentation shows "come back in 3 months to recheck testosterone if making a dose change." ? ?Pt did not know what the timing should be. ? ?Please put in orders and let [facility] admin know for when the appointment should be scheduled.  ?

## 2021-06-16 NOTE — Telephone Encounter (Signed)
Ok to place Testosterone Lab order? ?

## 2021-06-16 NOTE — Telephone Encounter (Signed)
Ok to place order and he should have the level drawn at the midpoint between his injections. ? ?Algis Greenhouse. [name], MD ?06/16/2021 10:12 AM  ? ?

## 2021-06-17 NOTE — Telephone Encounter (Signed)
Patient scheduled.

## 2021-06-24 ENCOUNTER — Other Ambulatory Visit (INDEPENDENT_AMBULATORY_CARE_PROVIDER_SITE_OTHER): Payer: PRIVATE HEALTH INSURANCE

## 2021-06-24 DIAGNOSIS — R7989 Other specified abnormal findings of blood chemistry: Secondary | ICD-10-CM | POA: Diagnosis not present

## 2021-06-25 LAB — TESTOSTERONE: Testosterone: 901.62 ng/dL — ABNORMAL HIGH (ref 300.00–890.00)

## 2021-06-26 NOTE — Progress Notes (Signed)
Please inform patient of the following:  His testosterone level is a bit higher than we need for it to be and we probably should come down on the dose. Can we clarify how much he is taking? If he is currently on '200mg'$  weekly then we should decrease to '150mg'$  weekly and recheck again in 2-3 months.  Dale Weber. [name], MD 06/26/2021 2:11 PM

## 2021-07-23 ENCOUNTER — Other Ambulatory Visit: Payer: Self-pay | Admitting: Family Medicine

## 2021-07-23 NOTE — Telephone Encounter (Signed)
Pt requesting refill for Testosterone, according to last lab draw 5/24 pt is suppose to decrease to 150 mg once a week per Dr. [name]. Last OV 05/29/2021.

## 2021-08-28 ENCOUNTER — Other Ambulatory Visit: Payer: Self-pay | Admitting: *Deleted

## 2021-08-28 ENCOUNTER — Telehealth: Payer: Self-pay | Admitting: Family Medicine

## 2021-08-28 DIAGNOSIS — R7989 Other specified abnormal findings of blood chemistry: Secondary | ICD-10-CM

## 2021-08-28 NOTE — Telephone Encounter (Signed)
Please call pt and schedule lab appt only needs to be first thing in the morning. Orders are in [place].

## 2021-08-28 NOTE — Telephone Encounter (Signed)
Labs

## 2021-09-04 ENCOUNTER — Other Ambulatory Visit (INDEPENDENT_AMBULATORY_CARE_PROVIDER_SITE_OTHER): Payer: PRIVATE HEALTH INSURANCE

## 2021-09-04 DIAGNOSIS — R7989 Other specified abnormal findings of blood chemistry: Secondary | ICD-10-CM | POA: Diagnosis not present

## 2021-09-04 LAB — TESTOSTERONE: Testosterone: 641.18 ng/dL (ref 300.00–890.00)

## 2021-09-07 NOTE — Progress Notes (Signed)
Please inform patient of the following:  Testosterone back in goal range.  He can continue his current dose and we can recheck again in about a year.

## 2021-09-25 ENCOUNTER — Other Ambulatory Visit: Payer: Self-pay | Admitting: Physician Assistant

## 2021-10-26 ENCOUNTER — Encounter: Payer: Self-pay | Admitting: *Deleted

## 2021-11-11 ENCOUNTER — Other Ambulatory Visit: Payer: Self-pay | Admitting: Family Medicine

## 2021-11-12 NOTE — Telephone Encounter (Signed)
Pt requesting refill for Testosterone. Last OV 05/29/2021.

## 2021-11-30 ENCOUNTER — Other Ambulatory Visit: Payer: Self-pay | Admitting: Family Medicine

## 2022-01-11 ENCOUNTER — Other Ambulatory Visit: Payer: Self-pay | Admitting: Family Medicine

## 2022-01-14 ENCOUNTER — Encounter: Payer: Self-pay | Admitting: *Deleted

## 2022-02-26 ENCOUNTER — Other Ambulatory Visit: Payer: Self-pay | Admitting: Family Medicine

## 2022-07-16 ENCOUNTER — Telehealth: Payer: Self-pay

## 2022-07-16 ENCOUNTER — Other Ambulatory Visit: Payer: Self-pay | Admitting: Family Medicine

## 2022-07-16 ENCOUNTER — Encounter: Payer: Self-pay | Admitting: Family Medicine

## 2022-07-16 ENCOUNTER — Ambulatory Visit (INDEPENDENT_AMBULATORY_CARE_PROVIDER_SITE_OTHER): Payer: PRIVATE HEALTH INSURANCE | Admitting: Family Medicine

## 2022-07-16 VITALS — BP 127/85 | HR 77 | Temp 97.7°F | Ht 72.0 in | Wt 266.4 lb

## 2022-07-16 DIAGNOSIS — E785 Hyperlipidemia, unspecified: Secondary | ICD-10-CM | POA: Diagnosis not present

## 2022-07-16 DIAGNOSIS — Z0001 Encounter for general adult medical examination with abnormal findings: Secondary | ICD-10-CM

## 2022-07-16 DIAGNOSIS — R739 Hyperglycemia, unspecified: Secondary | ICD-10-CM

## 2022-07-16 DIAGNOSIS — R7989 Other specified abnormal findings of blood chemistry: Secondary | ICD-10-CM

## 2022-07-16 DIAGNOSIS — I10 Essential (primary) hypertension: Secondary | ICD-10-CM

## 2022-07-16 LAB — COMPREHENSIVE METABOLIC PANEL
ALT: 31 U/L (ref 0–53)
AST: 24 U/L (ref 0–37)
Albumin: 4 g/dL (ref 3.5–5.2)
Alkaline Phosphatase: 66 U/L (ref 39–117)
BUN: 14 mg/dL (ref 6–23)
CO2: 29 mEq/L (ref 19–32)
Calcium: 9 mg/dL (ref 8.4–10.5)
Chloride: 102 mEq/L (ref 96–112)
Creatinine, Ser: 1.36 mg/dL (ref 0.40–1.50)
GFR: 63.75 mL/min (ref >60.00)
Glucose, Bld: 93 mg/dL (ref 70–99)
Potassium: 4.4 mEq/L (ref 3.5–5.1)
Sodium: 138 mEq/L (ref 135–145)
Total Bilirubin: 0.7 mg/dL (ref 0.2–1.2)
Total Protein: 6.8 g/dL (ref 6.0–8.3)

## 2022-07-16 LAB — CBC
HCT: 49.2 % (ref 39.0–52.0)
Hemoglobin: 16.7 g/dL (ref 13.0–17.0)
MCHC: 33.8 g/dL (ref 30.0–36.0)
MCV: 85.2 fl (ref 78.0–100.0)
Platelets: 241 K/uL (ref 150.0–400.0)
RBC: 5.77 Mil/uL (ref 4.22–5.81)
RDW: 13.6 % (ref 11.5–15.5)
WBC: 7.3 K/uL (ref 4.0–10.5)

## 2022-07-16 LAB — LDL CHOLESTEROL, DIRECT: Direct LDL: 138 mg/dL

## 2022-07-16 LAB — HEMOGLOBIN A1C: Hgb A1c MFr Bld: 5.8 % (ref 4.6–6.5)

## 2022-07-16 LAB — TSH: TSH: 2.7 u[IU]/mL (ref 0.35–5.50)

## 2022-07-16 LAB — LIPID PANEL
Cholesterol: 201 mg/dL — ABNORMAL HIGH (ref 0–200)
HDL: 35.6 mg/dL — ABNORMAL LOW
NonHDL: 165.23
Total CHOL/HDL Ratio: 6
Triglycerides: 228 mg/dL — ABNORMAL HIGH (ref 0.0–149.0)
VLDL: 45.6 mg/dL — ABNORMAL HIGH (ref 0.0–40.0)

## 2022-07-16 MED ORDER — TESTOSTERONE CYPIONATE 200 MG/ML IM SOLN: 100.0000 mg | INTRAMUSCULAR | 0 refills | Status: DC

## 2022-07-16 MED ORDER — ZEPBOUND 2.5 MG/0.5ML ~~LOC~~ SOAJ: 2.5000 mg | SUBCUTANEOUS | 0 refills | Status: DC

## 2022-07-16 NOTE — Assessment & Plan Note (Addendum)
BMI today 36 with comorbidities including hypertension, dyslipidemia.  We discussed treatment options.  He is working on diet and exercise.  Believe he would do well with GLP-1 agonist.  We will start Zepbound 2.5 mg weekly.  He will follow-up with [place] in a few weeks and we can titrate the dose as needed.  We discussed side effects.

## 2022-07-16 NOTE — Progress Notes (Signed)
Chief Complaint:  Dale Weber is a 42 y.o. male who presents today for his annual comprehensive physical exam.    Assessment/Plan:  Chronic Problems Addressed Today: Morbid obesity (HCC) BMI today 36 with comorbidities including hypertension, dyslipidemia.  We discussed treatment options.  He is working on diet and exercise.  Believe he would do well with GLP-1 agonist.  We will start Zepbound 2.5 mg weekly.  He will follow-up with [place] in a few weeks and we can titrate the dose as needed.  We discussed side effects.  Essential hypertension At goal on losartan 100 mg daily.  Dyslipidemia Check lipid.  Discussed lifestyle modifications.  Hyperglycemia Check A1c.  Low testosterone On testosterone 100 mg IM weekly.  Check testosterone level today.  Preventative Healthcare: Check labs.  Up-to-date on vaccines and screenings.  Needs colonoscopy age 47.  Patient Counseling(The following topics were reviewed and/or handout was given):  -Nutrition: Stressed importance of moderation in sodium/caffeine intake, saturated fat and cholesterol, caloric balance, sufficient intake of fresh fruits, vegetables, and fiber.  -Stressed the importance of regular exercise.   -Substance Abuse: Discussed cessation/primary prevention of tobacco, alcohol, or other drug use; driving or other dangerous activities under the influence; availability of treatment for abuse.   -Injury prevention: Discussed safety belts, safety helmets, smoke detector, smoking near bedding or upholstery.   -Sexuality: Discussed sexually transmitted diseases, partner selection, use of condoms, avoidance of unintended pregnancy and contraceptive alternatives.   -Dental health: Discussed importance of regular tooth brushing, flossing, and dental visits.  -Health maintenance and immunizations reviewed. Please refer to Health maintenance section.  Return to care in 1 year for next preventative visit.     Subjective:  HPI:  He has  no acute complaints today.   Lifestyle Diet: None specific.  Exercise: Going to gym routinely 4-5 times per week.      07/16/2022    7:42 AM  Depression screen PHQ 2/9  Decreased Interest 0  Down, Depressed, Hopeless 0  PHQ - 2 Score 0   ROS: Per HPI, otherwise a complete review of systems was negative.   PMH:  The following were reviewed and entered/updated in epic: Past Medical History:  Diagnosis Date   Hypertension    LAP-BAND surgery status    Patient Active Problem List   Diagnosis Date Noted   Morbid obesity (HCC) 07/16/2022   Hyperglycemia 05/29/2020   Low testosterone 01/17/2020   Dyslipidemia 04/27/2019   Essential hypertension 04/01/2017   Past Surgical History:  Procedure Laterality Date   LAPAROSCOPIC GASTRIC BANDING     LASIK      Family History  Problem Relation Age of Onset   Diabetes Father    Heart disease Father    Diabetes Brother     Medications- reviewed and updated Current Outpatient Medications  Medication Sig Dispense Refill   losartan (COZAAR) 100 MG tablet TAKE 1 TABLET BY MOUTH EVERY DAY 90 tablet 1   tirzepatide (ZEPBOUND) 2.5 MG/0.5ML Pen Inject 2.5 mg into the skin once a week. 2 mL 0   testosterone cypionate (DEPOTESTOSTERONE CYPIONATE) 200 MG/ML injection Inject 0.5 mLs (100 mg total) into the muscle once a week. 5 mL 0   No current facility-administered medications for this visit.    Allergies-reviewed and updated Allergies  Allergen Reactions   Augmentin [Amoxicillin-Pot Clavulanate] Nausea And Vomiting    Social History   Socioeconomic History   Marital status: Married    Spouse name: Not on file   Number of children:  5   Years of education: Not on file   Highest education level: Not on file  Occupational History   Not on file  Tobacco Use   Smoking status: Never   Smokeless tobacco: Never  Substance and Sexual Activity   Alcohol use: No   Drug use: No   Sexual activity: Never  Other Topics Concern   Not  on file  Social History Narrative   Not on file   Social Determinants of Health   Financial Resource Strain: Not on file  Food Insecurity: Not on file  Transportation Needs: Not on file  Physical Activity: Not on file  Stress: Not on file  Social Connections: Not on file        Objective:  Physical Exam: BP 127/85   Pulse 77   Temp 97.7 F (36.5 C) (Temporal)   Ht 6' (1.829 m)   Wt 266 lb 6.4 oz (120.8 kg)   SpO2 96%   BMI 36.13 kg/m   Body mass index is 36.13 kg/m. Wt Readings from Last 3 Encounters:  07/16/22 266 lb 6.4 oz (120.8 kg)  05/29/21 243 lb 12.8 oz (110.6 kg)  05/27/20 256 lb 3.2 oz (116.2 kg)   Gen: NAD, resting comfortably HEENT: TMs normal bilaterally. OP clear. No thyromegaly noted.  CV: RRR with no murmurs appreciated Pulm: NWOB, CTAB with no crackles, wheezes, or rhonchi GI: Normal bowel sounds present. Soft, Nontender, Nondistended. MSK: no edema, cyanosis, or clubbing noted Skin: warm, dry Neuro: CN2-12 grossly intact. Strength 5/5 in upper and lower extremities. Reflexes symmetric and intact bilaterally.  Psych: Normal affect and thought content     Sherial Ebrahim M. Jimmey Ralph, MD 07/16/2022 8:08 AM

## 2022-07-16 NOTE — Assessment & Plan Note (Signed)
At goal on losartan 100 mg daily. 

## 2022-07-16 NOTE — Assessment & Plan Note (Signed)
Check A1c. 

## 2022-07-16 NOTE — Patient Instructions (Signed)
It was very nice to see you today!  We will check blood work today.  Will start Zepbound.  Let me know how you are doing with this in a few weeks and we can increase the dose as needed.  Return in about 1 year (around 07/16/2023) for Annual Physical.   Take care, Dr Jimmey Ralph  PLEASE NOTE:  If you had any lab tests, please let us know if you have not heard back within a few days. You may see your results on mychart before we have a chance to review them but we will give you a call once they are reviewed by [place].   If we ordered any referrals today, please let us know if you have not heard from their office within the next week.   If you had any urgent prescriptions sent in today, please check with the pharmacy within an hour of our visit to make sure the prescription was transmitted appropriately.   Please try these tips to maintain a healthy lifestyle:  Eat at least 3 REAL meals and 1-2 snacks per day.  Aim for no more than 5 hours between eating.  If you eat breakfast, please do so within one hour of getting up.   Each meal should contain half fruits/vegetables, one quarter protein, and one quarter carbs (no bigger than a computer mouse)  Cut down on sweet beverages. This includes juice, soda, and sweet tea.   Drink at least 1 glass of water with each meal and aim for at least 8 glasses per day  Exercise at least 150 minutes every week.    Preventive Care 92-82 Years Old, Male Preventive care refers to lifestyle choices and visits with your health care provider that can promote health and wellness. Preventive care visits are also called wellness exams. What can I expect for my preventive care visit? Counseling During your preventive care visit, your health care provider may ask about your: Medical history, including: Past medical problems. Family medical history. Current health, including: Emotional well-being. Home life and relationship well-being. Sexual  activity. Lifestyle, including: Alcohol, nicotine or tobacco, and drug use. Access to firearms. Diet, exercise, and sleep habits. Safety issues such as seatbelt and bike helmet use. Sunscreen use. Work and work Astronomer. Physical exam Your health care provider will check your: Height and weight. These may be used to calculate your BMI (body mass index). BMI is a measurement that tells if you are at a healthy weight. Waist circumference. This measures the distance around your waistline. This measurement also tells if you are at a healthy weight and may help predict your risk of certain diseases, such as type 2 diabetes and high blood pressure. Heart rate and blood pressure. Body temperature. Skin for abnormal spots. What immunizations do I need?  Vaccines are usually given at various ages, according to a schedule. Your health care provider will recommend vaccines for you based on your age, medical history, and lifestyle or other factors, such as travel or where you work. What tests do I need? Screening Your health care provider may recommend screening tests for certain conditions. This may include: Lipid and cholesterol levels. Diabetes screening. This is done by checking your blood sugar (glucose) after you have not eaten for a while (fasting). Hepatitis B test. Hepatitis C test. HIV (human immunodeficiency virus) test. STI (sexually transmitted infection) testing, if you are at risk. Lung cancer screening. Prostate cancer screening. Colorectal cancer screening. Talk with your health care provider about your test results,  treatment options, and if necessary, the need for more tests. Follow these instructions at home: Eating and drinking  Eat a diet that includes fresh fruits and vegetables, whole grains, lean protein, and low-fat dairy products. Take vitamin and mineral supplements as recommended by your health care provider. Do not drink alcohol if your health care provider  tells you not to drink. If you drink alcohol: Limit how much you have to 0-2 drinks a day. Know how much alcohol is in your drink. In the [place] one drink equals one 12 oz bottle of beer (355 mL), one 5 oz glass of wine (148 mL), or one 1 oz glass of hard liquor (44 mL). Lifestyle Brush your teeth every morning and night with fluoride toothpaste. Floss one time each day. Exercise for at least 30 minutes 5 or more days each week. Do not use any products that contain nicotine or tobacco. These products include cigarettes, chewing tobacco, and vaping devices, such as e-cigarettes. If you need help quitting, ask your health care provider. Do not use drugs. If you are sexually active, practice safe sex. Use a condom or other form of protection to prevent STIs. Take aspirin only as told by your health care provider. Make sure that you understand how much to take and what form to take. Work with your health care provider to find out whether it is safe and beneficial for you to take aspirin daily. Find healthy ways to manage stress, such as: Meditation, yoga, or listening to music. Journaling. Talking to a trusted person. Spending time with friends and family. Minimize exposure to UV radiation to reduce your risk of skin cancer. Safety Always wear your seat belt while driving or riding in a vehicle. Do not drive: If you have been drinking alcohol. Do not ride with someone who has been drinking. When you are tired or distracted. While texting. If you have been using any mind-altering substances or drugs. Wear a helmet and other protective equipment during sports activities. If you have firearms in your house, make sure you follow all gun safety procedures. What's next? Go to your health care provider once a year for an annual wellness visit. Ask your health care provider how often you should have your eyes and teeth checked. Stay up to date on all vaccines. This information is not intended to  replace advice given to you by your health care provider. Make sure you discuss any questions you have with your health care provider. Document Revised: 07/16/2020 Document Reviewed: 07/16/2020 Elsevier Patient Education  2024 [employer].

## 2022-07-16 NOTE — Telephone Encounter (Signed)
harmacy comment: Alternative Requested:PLAN EXCLUSION, NOT COVERED. SEND ALTERNATIVE PLEASE.

## 2022-07-16 NOTE — Telephone Encounter (Signed)
Pharmacy Patient Advocate Encounter   Received notification from CoverMyMeds that prior authorization for Testosterone Cypionate is required/requested.   Insurance verification completed.   The patient is insured through [facility] .    PA submitted to [facility] via PA options: CoverMyMeds Key/confirmation #/[id number] Status is pending

## 2022-07-16 NOTE — Telephone Encounter (Signed)
They should check to see what alternative are covered or if PA is needed.  Dale Weber. Jimmey Ralph, MD 07/16/2022 9:37 AM

## 2022-07-16 NOTE — Assessment & Plan Note (Signed)
On testosterone 100 mg IM weekly.  Check testosterone level today.

## 2022-07-16 NOTE — Assessment & Plan Note (Signed)
Check lipid.  Discussed lifestyle modifications.

## 2022-07-19 NOTE — Telephone Encounter (Signed)
PA has been APPROVED from 07/16/2022-07/16/2023

## 2022-07-19 NOTE — Progress Notes (Signed)
His cholesterol and blood sugar are both borderline elevated but stable compared to previous.  Do not need start meds but he should continue to work on diet and exercise and we can recheck in a year.  It looks like his testosterone levels not run-can we see if we can add this on.  The rest of his labs are all stable and we can recheck everything in a year.

## 2022-07-24 ENCOUNTER — Encounter: Payer: Self-pay | Admitting: Family Medicine

## 2022-07-29 NOTE — Telephone Encounter (Signed)
Ok with writing letter stating that patient has morbid obesity due to BMI greater than 35 with multiple comorbidities and that he would benefit from starting pharmacologic weight loss to prevent future complications from obesity.  Recommend starting Zepbound as it has been shown to be most effective weight loss medication.

## 2022-08-03 NOTE — Telephone Encounter (Signed)
Please advise 

## 2022-08-03 NOTE — Telephone Encounter (Signed)
Can we make sure his letter was sent per my last message?  Katina Degree. Jimmey Ralph, MD 08/03/2022 3:38 PM

## 2022-08-19 ENCOUNTER — Other Ambulatory Visit: Payer: Self-pay | Admitting: Family Medicine

## 2022-08-22 ENCOUNTER — Other Ambulatory Visit: Payer: Self-pay | Admitting: Family Medicine

## 2022-08-23 NOTE — Telephone Encounter (Signed)
Pt requesting refill for Testosterone. Last OV 07/16/2022.

## 2022-10-16 ENCOUNTER — Other Ambulatory Visit: Payer: Self-pay | Admitting: Family Medicine

## 2023-02-07 ENCOUNTER — Other Ambulatory Visit: Payer: Self-pay | Admitting: Family Medicine

## 2023-06-06 ENCOUNTER — Other Ambulatory Visit: Payer: Self-pay | Admitting: Family Medicine

## 2023-07-02 ENCOUNTER — Other Ambulatory Visit: Payer: Self-pay | Admitting: Family Medicine

## 2023-07-28 ENCOUNTER — Other Ambulatory Visit: Payer: Self-pay | Admitting: Family Medicine

## 2023-08-02 ENCOUNTER — Other Ambulatory Visit: Payer: Self-pay | Admitting: Family Medicine

## 2023-08-02 NOTE — Telephone Encounter (Signed)
 Pt requesting refill for Testosterone  100 mg weekly injection. Last OV 07/16/2022. Pt is scheduled 09/15/2023.

## 2023-09-15 ENCOUNTER — Encounter: Payer: PRIVATE HEALTH INSURANCE | Admitting: Family Medicine

## 2023-11-15 ENCOUNTER — Ambulatory Visit (INDEPENDENT_AMBULATORY_CARE_PROVIDER_SITE_OTHER): Payer: PRIVATE HEALTH INSURANCE | Admitting: Family Medicine

## 2023-11-15 ENCOUNTER — Encounter: Payer: Self-pay | Admitting: Family Medicine

## 2023-11-15 VITALS — BP 120/78 | HR 93 | Temp 98.2°F | Ht 72.0 in | Wt 230.0 lb

## 2023-11-15 DIAGNOSIS — R739 Hyperglycemia, unspecified: Secondary | ICD-10-CM

## 2023-11-15 DIAGNOSIS — I10 Essential (primary) hypertension: Secondary | ICD-10-CM

## 2023-11-15 DIAGNOSIS — F909 Attention-deficit hyperactivity disorder, unspecified type: Secondary | ICD-10-CM

## 2023-11-15 DIAGNOSIS — R7989 Other specified abnormal findings of blood chemistry: Secondary | ICD-10-CM

## 2023-11-15 DIAGNOSIS — E785 Hyperlipidemia, unspecified: Secondary | ICD-10-CM

## 2023-11-15 DIAGNOSIS — Z114 Encounter for screening for human immunodeficiency virus [HIV]: Secondary | ICD-10-CM

## 2023-11-15 DIAGNOSIS — R399 Unspecified symptoms and signs involving the genitourinary system: Secondary | ICD-10-CM

## 2023-11-15 DIAGNOSIS — Z0001 Encounter for general adult medical examination with abnormal findings: Secondary | ICD-10-CM

## 2023-11-15 DIAGNOSIS — Z13220 Encounter for screening for lipoid disorders: Secondary | ICD-10-CM

## 2023-11-15 DIAGNOSIS — Z1159 Encounter for screening for other viral diseases: Secondary | ICD-10-CM

## 2023-11-15 LAB — COMPREHENSIVE METABOLIC PANEL WITH GFR
ALT: 22 U/L (ref 0–53)
AST: 19 U/L (ref 0–37)
Albumin: 4.1 g/dL (ref 3.5–5.2)
Alkaline Phosphatase: 59 U/L (ref 39–117)
BUN: 16 mg/dL (ref 6–23)
CO2: 29 meq/L (ref 19–32)
Calcium: 8.6 mg/dL (ref 8.4–10.5)
Chloride: 102 meq/L (ref 96–112)
Creatinine, Ser: 1.25 mg/dL (ref 0.40–1.50)
GFR: 69.89 mL/min (ref >60.00)
Glucose, Bld: 95 mg/dL (ref 70–99)
Potassium: 3.9 meq/L (ref 3.5–5.1)
Sodium: 137 meq/L (ref 135–145)
Total Bilirubin: 0.8 mg/dL (ref 0.2–1.2)
Total Protein: 6.4 g/dL (ref 6.0–8.3)

## 2023-11-15 LAB — CBC
HCT: 46 % (ref 39.0–52.0)
Hemoglobin: 15.6 g/dL (ref 13.0–17.0)
MCHC: 33.9 g/dL (ref 30.0–36.0)
MCV: 83.7 fl (ref 78.0–100.0)
Platelets: 234 K/uL (ref 150.0–400.0)
RBC: 5.5 Mil/uL (ref 4.22–5.81)
RDW: 13.3 % (ref 11.5–15.5)
WBC: 6.8 K/uL (ref 4.0–10.5)

## 2023-11-15 LAB — LIPID PANEL
Cholesterol: 180 mg/dL (ref 0–200)
HDL: 38.4 mg/dL — ABNORMAL LOW (ref >39.00)
LDL Cholesterol: 101 mg/dL — ABNORMAL HIGH (ref 0–99)
NonHDL: 141.33
Total CHOL/HDL Ratio: 5
Triglycerides: 201 mg/dL — ABNORMAL HIGH (ref 0.0–149.0)
VLDL: 40.2 mg/dL — ABNORMAL HIGH (ref 0.0–40.0)

## 2023-11-15 LAB — HEMOGLOBIN A1C: Hgb A1c MFr Bld: 5.6 % (ref 4.6–6.5)

## 2023-11-15 LAB — PSA: PSA: 1.09 ng/mL (ref 0.10–4.00)

## 2023-11-15 LAB — TESTOSTERONE: Testosterone: 489.38 ng/dL (ref 300.00–890.00)

## 2023-11-15 LAB — TSH: TSH: 1.12 u[IU]/mL (ref 0.35–5.50)

## 2023-11-15 MED ORDER — TADALAFIL 5 MG PO TABS: 5.0000 mg | ORAL_TABLET | Freq: Every day | ORAL | 3 refills | Status: AC

## 2023-11-15 MED ORDER — ZEPBOUND 15 MG/0.5ML ~~LOC~~ SOLN: 15.0000 mg | SUBCUTANEOUS | 4 refills | Status: AC

## 2023-11-15 NOTE — Assessment & Plan Note (Signed)
 Check labs today.  He is on testosterone  100 mg weekly.

## 2023-11-15 NOTE — Assessment & Plan Note (Signed)
 He was started on Cialis 5 mg daily since our last visit.  Doing well with this.  Also helps with erection.  Will refill today.  Check PSA with labs.

## 2023-11-15 NOTE — Assessment & Plan Note (Signed)
 Patient is down 36 pounds since our last visit.  He is currently on Zepbound  though has been getting this through compounding pharmacy.  He would like for us  to take over prescription for this and send it to [facility].  He is currently on 15 mg weekly.  Doing well with this.  No significant side effects and is down 36 pounds since last year.  Will send prescription in for him today.  Check labs.  Discussed lifestyle modifications.

## 2023-11-15 NOTE — Progress Notes (Signed)
 Chief Complaint:  Dale Weber is a 43 y.o. male who presents today for his annual comprehensive physical exam.    Assessment/Plan:  Chronic Problems Addressed Today: Essential hypertension At goal today on losartan  100 mg daily.  Dyslipidemia Check lipids.  Discussed lifestyle modifications.  He is doing great job with diet and exercise and is down 36 pounds since our last visit.  Low testosterone  Check labs today.  He is on testosterone  100 mg weekly.  Hyperglycemia Check A1c with labs.  ADHD Patient diagnosed with ADHD as a child.  Recently restarted Adderall and was doing well with this.  Morbid obesity (HCC) Patient is down 36 pounds since our last visit.  He is currently on Zepbound  though has been getting this through compounding pharmacy.  He would like for us  to take over prescription for this and send it to [facility].  He is currently on 15 mg weekly.  Doing well with this.  No significant side effects and is down 36 pounds since last year.  Will send prescription in for him today.  Check labs.  Discussed lifestyle modifications.  Lower urinary tract symptoms (LUTS) He was started on Cialis 5 mg daily since our last visit.  Doing well with this.  Also helps with erection.  Will refill today.  Check PSA with labs.   Preventative Healthcare: Check labs.  Up-to-date on flu vaccine.  Patient Counseling(The following topics were reviewed and/or handout was given):  -Nutrition: Stressed importance of moderation in sodium/caffeine intake, saturated fat and cholesterol, caloric balance, sufficient intake of fresh fruits, vegetables, and fiber.  -Stressed the importance of regular exercise.   -Substance Abuse: Discussed cessation/primary prevention of tobacco, alcohol, or other drug use; driving or other dangerous activities under the influence; availability of treatment for abuse.   -Injury prevention: Discussed safety belts, safety helmets, smoke detector, smoking near  bedding or upholstery.   -Sexuality: Discussed sexually transmitted diseases, partner selection, use of condoms, avoidance of unintended pregnancy and contraceptive alternatives.   -Dental health: Discussed importance of regular tooth brushing, flossing, and dental visits.  -Health maintenance and immunizations reviewed. Please refer to Health maintenance section.  Return to care in 1 year for next preventative visit.     Subjective:  HPI:  He has no acute complaints today. Patient is here today for his annual physical.  See assessment / plan for status of chronic conditions.  Discussed the use of AI scribe software for clinical note transcription with the patient, who gave verbal consent to proceed.  History of Present Illness Dale Weber is a 43 year old male who presents for an annual physical exam.  He started Adderall two months ago, which he had previously taken as a child. He notes an immediate improvement in his ability to manage daily tasks and reduced stress levels at work. He experiences dry mouth and occasional teeth grinding as side effects. Occasionally, he finds it harder to fall back asleep if he wakes up at night.  He uses a compounded version of Zepbound  due to insurance not covering the original prescription. He anticipates running out of his current supply by December. He has been paying approximately $400 per month for the medication and is on a 15 mg dose.  He takes Cialis to aid with urinary flow issues. He denies erectile dysfunction but notes improvement in lower urinary tract symptoms. He takes a low dose of 5 mg daily.  He reports a weight loss of 36 pounds since his last visit,  attributing it to his current medication regimen. He feels great and has been exercising regularly, including weight training and cardio. His diet has improved, with less craving for sugars and more intake of fruits and vegetables.     Lifestyle Diet: Balanced. Plenty of fruits and  vegetables.  Exercise: Weight training and cardio     11/15/2023   12:58 PM  Depression screen PHQ 2/9  Decreased Interest 0  Down, Depressed, Hopeless 0  PHQ - 2 Score 0    Health Maintenance Due  Topic Date Due   HIV Screening  Never done   Hepatitis C Screening  Never done   Hepatitis B Vaccines 19-59 Average Risk (1 of 3 - 19+ 3-dose series) Never done   HPV VACCINES (1 - 3-dose SCDM series) Never done   COVID-19 Vaccine (5 - 2025-26 season) 10/03/2023     ROS: Per HPI, otherwise a complete review of systems was negative.   PMH:  The following were reviewed and entered/updated in epic: Past Medical History:  Diagnosis Date   Hypertension    LAP-BAND surgery status    Patient Active Problem List   Diagnosis Date Noted   ADHD 11/15/2023   Lower urinary tract symptoms (LUTS) 11/15/2023   Morbid obesity (HCC) 07/16/2022   Hyperglycemia 05/29/2020   Low testosterone  01/17/2020   Dyslipidemia 04/27/2019   Essential hypertension 04/01/2017   Past Surgical History:  Procedure Laterality Date   LAPAROSCOPIC GASTRIC BANDING     LASIK      Family History  Problem Relation Age of Onset   Diabetes Father    Heart disease Father    Diabetes Brother     Medications- reviewed and updated Current Outpatient Medications  Medication Sig Dispense Refill   amphetamine-dextroamphetamine (ADDERALL XR) 10 MG 24 hr capsule Take 10 mg by mouth every morning.     losartan  (COZAAR ) 100 MG tablet TAKE 1 TABLET BY MOUTH EVERY DAY 90 tablet 1   testosterone  cypionate (DEPOTESTOSTERONE CYPIONATE) 200 MG/ML injection INJECT 0.5 MLS (100 MG TOTAL) INTO THE MUSCLE ONCE A WEEK. 4 mL 1   Tirzepatide -Weight Management (ZEPBOUND ) 15 MG/0.5ML SOLN Inject 15 mg into the skin once a week. 6 mL 4   valACYclovir (VALTREX) 500 MG tablet Take 250 mg by mouth daily.     tadalafil (CIALIS) 5 MG tablet Take 1 tablet (5 mg total) by mouth daily. 90 tablet 3   No current facility-administered  medications for this visit.    Allergies-reviewed and updated Allergies  Allergen Reactions   Augmentin [Amoxicillin-Pot Clavulanate] Nausea And Vomiting    Social History   Socioeconomic History   Marital status: Married    Spouse name: Not on file   Number of children: 5   Years of education: Not on file   Highest education level: Not on file  Occupational History   Not on file  Tobacco Use   Smoking status: Never   Smokeless tobacco: Never  Substance and Sexual Activity   Alcohol use: No   Drug use: No   Sexual activity: Never  Other Topics Concern   Not on file  Social History Narrative   Not on file   Social Drivers of Health   Financial Resource Strain: Not on file  Food Insecurity: Not on file  Transportation Needs: Not on file  Physical Activity: Not on file  Stress: Not on file  Social Connections: Not on file        Objective:  Physical Exam: BP 120/78  Pulse 93   Temp 98.2 F (36.8 C) (Temporal)   Ht 6' (1.829 m)   Wt 230 lb (104.3 kg)   SpO2 98%   BMI 31.19 kg/m   Body mass index is 31.19 kg/m. Wt Readings from Last 3 Encounters:  11/15/23 230 lb (104.3 kg)  07/16/22 266 lb 6.4 oz (120.8 kg)  05/29/21 243 lb 12.8 oz (110.6 kg)   Gen: NAD, resting comfortably HEENT: TMs normal bilaterally. OP clear. No thyromegaly noted.  CV: RRR with no murmurs appreciated Pulm: NWOB, CTAB with no crackles, wheezes, or rhonchi GI: Normal bowel sounds present. Soft, Nontender, Nondistended. MSK: no edema, cyanosis, or clubbing noted Skin: warm, dry Neuro: CN2-12 grossly intact. Strength 5/5 in upper and lower extremities. Reflexes symmetric and intact bilaterally.  Psych: Normal affect and thought content     Kaelin Holford M. Kennyth, MD 11/15/2023 1:33 PM

## 2023-11-15 NOTE — Assessment & Plan Note (Signed)
Check A1c with labs. 

## 2023-11-15 NOTE — Patient Instructions (Signed)
 It was very nice to see you today!  VISIT SUMMARY: Today, you had your annual physical exam. We discussed your current medications and their effects, including Adderall for ADHD, Zepbound  for weight management, and Cialis for urinary flow issues. You have lost 36 pounds since your last visit and are feeling great with regular exercise and a healthier diet. Blood work was ordered to check various health markers.  YOUR PLAN: ADULT WELLNESS VISIT: Routine wellness visit with no acute concerns. -Order blood work including A1c, testosterone , and PSA.  OBESITY: You have lost 36 pounds with improved diet and exercise. -Continue using Zepbound  15 mg. A prescription has been sent to [facility].  ATTENTION-DEFICIT HYPERACTIVITY DISORDER (ADHD): ADHD is managed with Adderall, resulting in significant improvement in stress and task management. Mild side effects are noted. -Continue taking Adderall as prescribed.  LOWER URINARY TRACT SYMPTOMS (LUTS): LUTS have improved with daily Cialis 5 mg. No erectile dysfunction is present. -Continue taking Cialis 5 mg daily. A prescription has been sent to [facility].  Return in about 1 year (around 11/14/2024) for Annual Physical.   Take care, Dr Kennyth  PLEASE NOTE:  If you had any lab tests, please let us  know if you have not heard back within a few days. You may see your results on mychart before we have a chance to review them but we will give you a call once they are reviewed by us .   If we ordered any referrals today, please let us  know if you have not heard from their office within the next week.   If you had any urgent prescriptions sent in today, please check with the pharmacy within an hour of our visit to make sure the prescription was transmitted appropriately.   Please try these tips to maintain a healthy lifestyle:  Eat at least 3 REAL meals and 1-2 snacks per day.  Aim for no more than 5 hours between eating.  If you eat breakfast,  please do so within one hour of getting up.   Each meal should contain half fruits/vegetables, one quarter protein, and one quarter carbs (no bigger than a computer mouse)  Cut down on sweet beverages. This includes juice, soda, and sweet tea.   Drink at least 1 glass of water with each meal and aim for at least 8 glasses per day  Exercise at least 150 minutes every week.    Preventive Care 76-25 Years Old, Male Preventive care refers to lifestyle choices and visits with your health care provider that can promote health and wellness. Preventive care visits are also called wellness exams. What can I expect for my preventive care visit? Counseling During your preventive care visit, your health care provider may ask about your: Medical history, including: Past medical problems. Family medical history. Current health, including: Emotional well-being. Home life and relationship well-being. Sexual activity. Lifestyle, including: Alcohol, nicotine or tobacco, and drug use. Access to firearms. Diet, exercise, and sleep habits. Safety issues such as seatbelt and bike helmet use. Sunscreen use. Work and work Astronomer. Physical exam Your health care provider will check your: Height and weight. These may be used to calculate your BMI (body mass index). BMI is a measurement that tells if you are at a healthy weight. Waist circumference. This measures the distance around your waistline. This measurement also tells if you are at a healthy weight and may help predict your risk of certain diseases, such as type 2 diabetes and high blood pressure. Heart rate  and blood pressure. Body temperature. Skin for abnormal spots. What immunizations do I need?  Vaccines are usually given at various ages, according to a schedule. Your health care provider will recommend vaccines for you based on your age, medical history, and lifestyle or other factors, such as travel or where you work. What tests do I  need? Screening Your health care provider may recommend screening tests for certain conditions. This may include: Lipid and cholesterol levels. Diabetes screening. This is done by checking your blood sugar (glucose) after you have not eaten for a while (fasting). Hepatitis B test. Hepatitis C test. HIV (human immunodeficiency virus) test. STI (sexually transmitted infection) testing, if you are at risk. Lung cancer screening. Prostate cancer screening. Colorectal cancer screening. Talk with your health care provider about your test results, treatment options, and if necessary, the need for more tests. Follow these instructions at home: Eating and drinking  Eat a diet that includes fresh fruits and vegetables, whole grains, lean protein, and low-fat dairy products. Take vitamin and mineral supplements as recommended by your health care provider. Do not drink alcohol if your health care provider tells you not to drink. If you drink alcohol: Limit how much you have to 0-2 drinks a day. Know how much alcohol is in your drink. In the [place] one drink equals one 12 oz bottle of beer (355 mL), one 5 oz glass of wine (148 mL), or one 1 oz glass of hard liquor (44 mL). Lifestyle Brush your teeth every morning and night with fluoride toothpaste. Floss one time each day. Exercise for at least 30 minutes 5 or more days each week. Do not use any products that contain nicotine or tobacco. These products include cigarettes, chewing tobacco, and vaping devices, such as e-cigarettes. If you need help quitting, ask your health care provider. Do not use drugs. If you are sexually active, practice safe sex. Use a condom or other form of protection to prevent STIs. Take aspirin only as told by your health care provider. Make sure that you understand how much to take and what form to take. Work with your health care provider to find out whether it is safe and beneficial for you to take aspirin daily. Find  healthy ways to manage stress, such as: Meditation, yoga, or listening to music. Journaling. Talking to a trusted person. Spending time with friends and family. Minimize exposure to UV radiation to reduce your risk of skin cancer. Safety Always wear your seat belt while driving or riding in a vehicle. Do not drive: If you have been drinking alcohol. Do not ride with someone who has been drinking. When you are tired or distracted. While texting. If you have been using any mind-altering substances or drugs. Wear a helmet and other protective equipment during sports activities. If you have firearms in your house, make sure you follow all gun safety procedures. What's next? Go to your health care provider once a year for an annual wellness visit. Ask your health care provider how often you should have your eyes and teeth checked. Stay up to date on all vaccines. This information is not intended to replace advice given to you by your health care provider. Make sure you discuss any questions you have with your health care provider. Document Revised: 07/16/2020 Document Reviewed: 07/16/2020 Elsevier Patient Education  2024 [employer].

## 2023-11-15 NOTE — Assessment & Plan Note (Signed)
 Patient diagnosed with ADHD as a child.  Recently restarted Adderall and was doing well with this.

## 2023-11-15 NOTE — Assessment & Plan Note (Signed)
 Check lipids.  Discussed lifestyle modifications.  He is doing great job with diet and exercise and is down 36 pounds since our last visit.

## 2023-11-15 NOTE — Assessment & Plan Note (Signed)
 At goal today on losartan 100 mg daily.

## 2023-11-21 LAB — HIV ANTIBODY (ROUTINE TESTING W REFLEX)
HIV 1&2 Ab, 4th Generation: NONREACTIVE
HIV FINAL INTERPRETATION: NEGATIVE

## 2023-11-21 LAB — HEPATITIS C ANTIBODY: Hepatitis C Ab: NONREACTIVE

## 2023-11-22 ENCOUNTER — Ambulatory Visit: Payer: Self-pay | Admitting: Family Medicine

## 2023-11-22 NOTE — Progress Notes (Signed)
 Is cholesterol is better than last year.  LDL was very mildly elevated but better than previous values.  He should keep up the great work with diet and exercise and we can recheck this again in a year or so.  All of his other labs are at goal we can recheck again in a year.

## 2023-12-04 ENCOUNTER — Other Ambulatory Visit: Payer: Self-pay | Admitting: Family Medicine

## 2024-01-28 ENCOUNTER — Other Ambulatory Visit: Payer: Self-pay | Admitting: Family Medicine

## 2024-11-16 ENCOUNTER — Encounter: Payer: Self-pay | Admitting: Family Medicine
